# Patient Record
Sex: Female | Born: 1990
Health system: Southern US, Community
[De-identification: ages and names within clinical notes are randomized; demographics above are authoritative.]

## PROBLEM LIST (undated history)

## (undated) DIAGNOSIS — J302 Other seasonal allergic rhinitis: Secondary | ICD-10-CM

## (undated) DIAGNOSIS — Z789 Other specified health status: Secondary | ICD-10-CM

## (undated) HISTORY — PX: APPENDECTOMY: SHX54

## (undated) HISTORY — PX: NO PAST SURGERIES: SHX2092

---

## 2015-02-14 HISTORY — PX: APPENDECTOMY: SHX54

## 2019-02-14 NOTE — L&D Delivery Note (Signed)
Delivery Note At 6:53 AM a viable female was delivered via Vaginal, Spontaneous (Presentation: LOA).  APGAR: 7, 8; weight 7 lb 3.5 oz (3275 g).  After 1 minute, the cord was clamped and cut. 40 units of pitocin diluted in 1000cc LR was infused rapidly IV.  The placenta separated spontaneously and delivered via CCT and maternal pushing effort.  It was inspected and appears to be intact with a 3 VC.  Anesthesia: Epidural Episiotomy: None Lacerations: None Suture Repair:  Est. Blood Loss (mL): 100  Mom to postpartum.  Baby to Couplet care / Skin to Skin.  Jennifer Fisher 12/16/2019, 9:02 AM

## 2019-12-11 ENCOUNTER — Other Ambulatory Visit: Payer: Self-pay

## 2019-12-11 ENCOUNTER — Encounter (HOSPITAL_COMMUNITY): Payer: Self-pay | Admitting: Obstetrics & Gynecology

## 2019-12-11 ENCOUNTER — Inpatient Hospital Stay (HOSPITAL_COMMUNITY)
Admission: AD | Admit: 2019-12-11 | Discharge: 2019-12-11 | Disposition: A | Discharge status: Home / Self Care | Payer: Medicaid Other | Attending: Obstetrics & Gynecology | Admitting: Obstetrics & Gynecology

## 2019-12-11 ENCOUNTER — Inpatient Hospital Stay (HOSPITAL_BASED_OUTPATIENT_CLINIC_OR_DEPARTMENT_OTHER): Payer: Medicaid Other

## 2019-12-11 DIAGNOSIS — Z3687 Encounter for antenatal screening for uncertain dates: Secondary | ICD-10-CM | POA: Diagnosis not present

## 2019-12-11 DIAGNOSIS — M549 Dorsalgia, unspecified: Secondary | ICD-10-CM | POA: Diagnosis not present

## 2019-12-11 DIAGNOSIS — Z3A38 38 weeks gestation of pregnancy: Secondary | ICD-10-CM

## 2019-12-11 DIAGNOSIS — O99353 Diseases of the nervous system complicating pregnancy, third trimester: Secondary | ICD-10-CM

## 2019-12-11 DIAGNOSIS — Z20822 Contact with and (suspected) exposure to covid-19: Secondary | ICD-10-CM | POA: Insufficient documentation

## 2019-12-11 DIAGNOSIS — O99019 Anemia complicating pregnancy, unspecified trimester: Secondary | ICD-10-CM | POA: Diagnosis present

## 2019-12-11 DIAGNOSIS — R519 Headache, unspecified: Secondary | ICD-10-CM | POA: Insufficient documentation

## 2019-12-11 DIAGNOSIS — O99891 Other specified diseases and conditions complicating pregnancy: Secondary | ICD-10-CM

## 2019-12-11 DIAGNOSIS — G44209 Tension-type headache, unspecified, not intractable: Secondary | ICD-10-CM

## 2019-12-11 DIAGNOSIS — O26893 Other specified pregnancy related conditions, third trimester: Secondary | ICD-10-CM | POA: Diagnosis not present

## 2019-12-11 DIAGNOSIS — Z3A36 36 weeks gestation of pregnancy: Secondary | ICD-10-CM | POA: Diagnosis not present

## 2019-12-11 DIAGNOSIS — Z789 Other specified health status: Secondary | ICD-10-CM | POA: Diagnosis present

## 2019-12-11 DIAGNOSIS — D649 Anemia, unspecified: Secondary | ICD-10-CM | POA: Insufficient documentation

## 2019-12-11 DIAGNOSIS — Z8759 Personal history of other complications of pregnancy, childbirth and the puerperium: Secondary | ICD-10-CM

## 2019-12-11 DIAGNOSIS — O99013 Anemia complicating pregnancy, third trimester: Secondary | ICD-10-CM | POA: Diagnosis not present

## 2019-12-11 DIAGNOSIS — M791 Myalgia, unspecified site: Secondary | ICD-10-CM

## 2019-12-11 DIAGNOSIS — Z79899 Other long term (current) drug therapy: Secondary | ICD-10-CM | POA: Insufficient documentation

## 2019-12-11 DIAGNOSIS — O0930 Supervision of pregnancy with insufficient antenatal care, unspecified trimester: Secondary | ICD-10-CM

## 2019-12-11 DIAGNOSIS — O0933 Supervision of pregnancy with insufficient antenatal care, third trimester: Secondary | ICD-10-CM | POA: Diagnosis not present

## 2019-12-11 DIAGNOSIS — Z3480 Encounter for supervision of other normal pregnancy, unspecified trimester: Secondary | ICD-10-CM

## 2019-12-11 DIAGNOSIS — Z0289 Encounter for other administrative examinations: Secondary | ICD-10-CM

## 2019-12-11 LAB — HEPATITIS PANEL, ACUTE
HCV Ab: NONREACTIVE
Hep A IgM: NONREACTIVE
Hep B C IgM: NONREACTIVE
Hepatitis B Surface Ag: NONREACTIVE

## 2019-12-11 LAB — URINALYSIS, ROUTINE W REFLEX MICROSCOPIC
Bilirubin Urine: NEGATIVE
Glucose, UA: NEGATIVE mg/dL
Hgb urine dipstick: NEGATIVE
Ketones, ur: NEGATIVE mg/dL
Nitrite: NEGATIVE
Protein, ur: NEGATIVE mg/dL
Specific Gravity, Urine: 1.015 (ref 1.005–1.030)
pH: 6 (ref 5.0–8.0)

## 2019-12-11 LAB — COMPREHENSIVE METABOLIC PANEL
ALT: 17 U/L (ref 0–44)
AST: 22 U/L (ref 15–41)
Albumin: 2.6 g/dL — ABNORMAL LOW (ref 3.5–5.0)
Alkaline Phosphatase: 159 U/L — ABNORMAL HIGH (ref 38–126)
Anion gap: 10 (ref 5–15)
BUN: 8 mg/dL (ref 6–20)
CO2: 20 mmol/L — ABNORMAL LOW (ref 22–32)
Calcium: 8.9 mg/dL (ref 8.9–10.3)
Chloride: 106 mmol/L (ref 98–111)
Creatinine, Ser: 0.46 mg/dL (ref 0.44–1.00)
GFR, Estimated: >60 mL/min (ref >60)
Glucose, Bld: 84 mg/dL (ref 70–99)
Potassium: 4 mmol/L (ref 3.5–5.1)
Sodium: 136 mmol/L (ref 135–145)
Total Bilirubin: 0.3 mg/dL (ref 0.3–1.2)
Total Protein: 6.6 g/dL (ref 6.5–8.1)

## 2019-12-11 LAB — CBC
HCT: 30.4 % — ABNORMAL LOW (ref 36.0–46.0)
Hemoglobin: 9.1 g/dL — ABNORMAL LOW (ref 12.0–15.0)
MCH: 21.8 pg — ABNORMAL LOW (ref 26.0–34.0)
MCHC: 29.9 g/dL — ABNORMAL LOW (ref 30.0–36.0)
MCV: 72.9 fL — ABNORMAL LOW (ref 80.0–100.0)
Platelets: 317 10*3/uL (ref 150–400)
RBC: 4.17 MIL/uL (ref 3.87–5.11)
RDW: 18.2 % — ABNORMAL HIGH (ref 11.5–15.5)
WBC: 9.3 10*3/uL (ref 4.0–10.5)
nRBC: 0 % (ref 0.0–0.2)

## 2019-12-11 LAB — DIFFERENTIAL
Abs Immature Granulocytes: 0.15 10*3/uL — ABNORMAL HIGH (ref 0.00–0.07)
Basophils Absolute: 0.1 10*3/uL (ref 0.0–0.1)
Basophils Relative: 1 %
Eosinophils Absolute: 0.4 10*3/uL (ref 0.0–0.5)
Eosinophils Relative: 4 %
Immature Granulocytes: 2 %
Lymphocytes Relative: 18 %
Lymphs Abs: 1.7 10*3/uL (ref 0.7–4.0)
Monocytes Absolute: 0.7 10*3/uL (ref 0.1–1.0)
Monocytes Relative: 8 %
Neutro Abs: 6.3 10*3/uL (ref 1.7–7.7)
Neutrophils Relative %: 67 %

## 2019-12-11 LAB — WET PREP, GENITAL
Clue Cells Wet Prep HPF POC: NONE SEEN
Sperm: NONE SEEN
Trich, Wet Prep: NONE SEEN
Yeast Wet Prep HPF POC: NONE SEEN

## 2019-12-11 LAB — TYPE AND SCREEN
ABO/RH(D): A POS
Antibody Screen: NEGATIVE

## 2019-12-11 LAB — HEPATITIS B SURFACE ANTIGEN: Hepatitis B Surface Ag: NONREACTIVE

## 2019-12-11 LAB — RESPIRATORY PANEL BY RT PCR (FLU A&B, COVID)
Influenza A by PCR: NEGATIVE
Influenza B by PCR: NEGATIVE
SARS Coronavirus 2 by RT PCR: NEGATIVE

## 2019-12-11 LAB — RAPID HIV SCREEN (HIV 1/2 AB+AG)
HIV 1/2 Antibodies: NONREACTIVE
HIV-1 P24 Antigen - HIV24: NONREACTIVE

## 2019-12-11 LAB — PROTEIN / CREATININE RATIO, URINE
Creatinine, Urine: 77.31 mg/dL
Protein Creatinine Ratio: 0.21 mg/mg{Cre} — ABNORMAL HIGH (ref 0.00–0.15)
Total Protein, Urine: 16 mg/dL

## 2019-12-11 MED ORDER — CYCLOBENZAPRINE HCL 5 MG PO TABS
10.0000 mg | ORAL_TABLET | Freq: Once | ORAL | Status: AC
  Administered 2019-12-11: 10 mg via ORAL
  Filled 2019-12-11: qty 2

## 2019-12-11 MED ORDER — FERROUS SULFATE 325 (65 FE) MG PO TABS: ORAL_TABLET | ORAL | 3 refills | Status: DC

## 2019-12-11 MED ORDER — ACETAMINOPHEN 500 MG PO TABS
1000.0000 mg | ORAL_TABLET | Freq: Once | ORAL | Status: AC
  Administered 2019-12-11: 1000 mg via ORAL
  Filled 2019-12-11: qty 2

## 2019-12-11 MED ORDER — PRENATAL VITAMIN 27-0.8 MG PO TABS: 1.0000 | ORAL_TABLET | Freq: Every day | ORAL | 3 refills | Status: DC

## 2019-12-11 MED ORDER — LACTATED RINGERS IV BOLUS
1000.0000 mL | Freq: Once | INTRAVENOUS | Status: AC
  Administered 2019-12-11: 1000 mL via INTRAVENOUS

## 2019-12-11 NOTE — Discharge Instructions (Signed)
[facility] for [facility] Prenatal Care Providers          If the office does not call you for appt by tomorrow please call this number and schedule an initial prenatal visit.  [facility]  [address] [phone]

## 2019-12-11 NOTE — MAU Provider Note (Signed)
History     [id number]  Arrival date and time: 12/11/19 1209    Chief Complaint  Patient presents with  . Headache     HPI Jennifer Fisher is a 29 y.o. at [redacted]w[redacted]d reportedly by late ultrasound completed 12/07/19 with no reported PMHx, who presents for body aches to MAU. Patient is an [place] refugee who came to the [place] 2 months ago. Patient has been seeking prenatal care at a refugee camp in [place] where she reports she had an ultrasound and lab work. She was taking birth control and is unsure of her LMP. This was an unplanned pregnancy. She states her Case Manager has this information, but does not present with her to the MAU. She denies any complications with this pregnancy. She takes medication for headaches, unsure which medication. No other medications during or outside of pregnancy. She has no known medical problems. She does have a history of an IUFD at 38w with her G2, unknown reason. She has no known history of hypertension or diabetes. She denies alcohol tobacco and drug use. She has had an appendectomy and denies any other abdominal surgeries. Her living children have no known issues and she denies issues with these vaginal deliveries.  Today she presents for generalized body aches. She localizes her body aches to her upper back and headache. She has not taken any medication for this issue. This issue started approx 2 months ago and has worsened since this time. Her back pain is worse with movement and improves with rest. It is bilateral and intermittent in nature. She additionally notes intermittent headaches, also worse in the past 2 months. No associated vision changes. She denies cp, sob, palpitations. No LE edema.   She endorses intermittent contractions. She denies LOF. She denies vaginal bleeding.  --/--/A POS (10/28 1446)  OB History    Gravida  5   Para  4   Term  4   Preterm      AB      Living  3     SAB      TAB      Ectopic       Multiple      Live Births  3           History reviewed. No pertinent past medical history.  Past Surgical History:  Procedure Laterality Date  . APPENDECTOMY      History reviewed. No pertinent family history.  Social History   Socioeconomic History  . Marital status: Married    Spouse name: Not on file  . Number of children: Not on file  . Years of education: Not on file  . Highest education level: Not on file  Occupational History  . Not on file  Tobacco Use  . Smoking status: Not on file  Substance and Sexual Activity  . Alcohol use: Not on file  . Drug use: Not on file  . Sexual activity: Not on file  Other Topics Concern  . Not on file  Social History Narrative  . Not on file   Social Determinants of Health   Financial Resource Strain:   . Difficulty of Paying Living Expenses: Not on file  Food Insecurity:   . Worried About Programme researcher, broadcasting/film/video in the Last Year: Not on file  . Ran Out of Food in the Last Year: Not on file  Transportation Needs:   . Lack of Transportation (Medical): Not on file  . Lack of Transportation (Non-Medical): Not on  file  Physical Activity:   . Days of Exercise per Week: Not on file  . Minutes of Exercise per Session: Not on file  Stress:   . Feeling of Stress : Not on file  Social Connections:   . Frequency of Communication with Friends and Family: Not on file  . Frequency of Social Gatherings with Friends and Family: Not on file  . Attends Religious Services: Not on file  . Active Member of Clubs or Organizations: Not on file  . Attends Banker Meetings: Not on file  . Marital Status: Not on file  Intimate Partner Violence:   . Fear of Current or Ex-Partner: Not on file  . Emotionally Abused: Not on file  . Physically Abused: Not on file  . Sexually Abused: Not on file    No Known Allergies  No current facility-administered medications on file prior to encounter.   No current outpatient medications on  file prior to encounter.     Review of Systems  Constitutional: Negative for chills and fever.  HENT: Negative for congestion.   Eyes: Negative for blurred vision and double vision.  Respiratory: Negative for cough, hemoptysis and shortness of breath.   Cardiovascular: Negative for chest pain, palpitations and leg swelling.  Gastrointestinal: Negative for abdominal pain, constipation, diarrhea, nausea and vomiting.  Genitourinary: Negative for dysuria, frequency, hematuria and urgency.  Musculoskeletal: Positive for myalgias.  Skin: Negative for rash.  Neurological: Negative for dizziness, focal weakness, loss of consciousness, weakness and headaches.     Physical Exam   BP 120/87 (BP Location: Right Arm)   Pulse (!) 106   Temp 98.4 F (36.9 C) (Oral)   Resp 20   Ht 4\' 11"  (1.499 m)   Wt 59.4 kg   SpO2 99%   BMI 26.44 kg/m   Physical Exam Vitals and nursing note reviewed. Exam conducted with a chaperone present.  Constitutional:      General: She is not in acute distress.    Appearance: Normal appearance. She is normal weight.  HENT:     Head: Normocephalic and atraumatic.     Nose: Nose normal.     Mouth/Throat:     Mouth: Mucous membranes are moist.     Pharynx: Oropharynx is clear.  Eyes:     Extraocular Movements: Extraocular movements intact.     Conjunctiva/sclera: Conjunctivae normal.  Cardiovascular:     Rate and Rhythm: Normal rate.     Pulses: Normal pulses.  Pulmonary:     Effort: Pulmonary effort is normal.  Genitourinary:    General: Normal vulva.     Vagina: Vaginal discharge (white, chunky) present.  Musculoskeletal:        General: Normal range of motion.     Cervical back: Normal range of motion and neck supple.  Skin:    General: Skin is warm and dry.  Neurological:     General: No focal deficit present.     Mental Status: She is alert and oriented to person, place, and time. Mental status is at baseline.     Cranial Nerves: No cranial  nerve deficit.     Sensory: No sensory deficit.     Motor: No weakness.     Gait: Gait normal.  Psychiatric:        Mood and Affect: Mood normal.        Behavior: Behavior normal.     Cervical Exam Dilation: Fingertip Effacement (%): Thick Station: -2 Exam by:: Dr. [email or web address]  Bedside  Ultrasound Formal u/s ordered in the setting of no prenatal care  My interpretation: n/a  FHT Baseline 140bpm, mod variability, +accels, no decels Toco: irritable, intermittent contractions Cat: 1  Labs Results for orders placed or performed during the hospital encounter of 12/11/19 (from the past 24 hour(s))  Urinalysis, Routine w reflex microscopic Urine, Clean Catch     Status: Abnormal   Collection Time: 12/11/19  1:07 PM  Result Value Ref Range   Color, Urine YELLOW YELLOW   APPearance CLOUDY (A) CLEAR   Specific Gravity, Urine 1.015 1.005 - 1.030   pH 6.0 5.0 - 8.0   Glucose, UA NEGATIVE NEGATIVE mg/dL   Hgb urine dipstick NEGATIVE NEGATIVE   Bilirubin Urine NEGATIVE NEGATIVE   Ketones, ur NEGATIVE NEGATIVE mg/dL   Protein, ur NEGATIVE NEGATIVE mg/dL   Nitrite NEGATIVE NEGATIVE   Leukocytes,Ua LARGE (A) NEGATIVE   RBC / HPF 0-5 0 - 5 RBC/hpf   WBC, UA 0-5 0 - 5 WBC/hpf   Bacteria, UA MANY (A) NONE SEEN   Squamous Epithelial / LPF 11-20 0 - 5  Protein / creatinine ratio, urine     Status: Abnormal   Collection Time: 12/11/19  1:07 PM  Result Value Ref Range   Creatinine, Urine 77.31 mg/dL   Total Protein, Urine 16 mg/dL   Protein Creatinine Ratio 0.21 (H) 0.00 - 0.15 mg/mg[Cre]  Hepatitis B surface antigen     Status: None   Collection Time: 12/11/19  2:45 PM  Result Value Ref Range   Hepatitis B Surface Ag NON REACTIVE NON REACTIVE  CBC     Status: Abnormal   Collection Time: 12/11/19  2:45 PM  Result Value Ref Range   WBC 9.3 4.0 - 10.5 K/uL   RBC 4.17 3.87 - 5.11 MIL/uL   Hemoglobin 9.1 (L) 12.0 - 15.0 g/dL   HCT 76.2 (L) 36 - 46 %   MCV 72.9 (L) 80.0 - 100.0 fL    MCH 21.8 (L) 26.0 - 34.0 pg   MCHC 29.9 (L) 30.0 - 36.0 g/dL   RDW 83.1 (H) 51.7 - 61.6 %   Platelets 317 150 - 400 K/uL   nRBC 0.0 0.0 - 0.2 %  Differential     Status: Abnormal   Collection Time: 12/11/19  2:45 PM  Result Value Ref Range   Neutrophils Relative % 67 %   Neutro Abs 6.3 1.7 - 7.7 K/uL   Lymphocytes Relative 18 %   Lymphs Abs 1.7 0.7 - 4.0 K/uL   Monocytes Relative 8 %   Monocytes Absolute 0.7 0.1 - 1.0 K/uL   Eosinophils Relative 4 %   Eosinophils Absolute 0.4 0.0 - 0.5 K/uL   Basophils Relative 1 %   Basophils Absolute 0.1 0.0 - 0.1 K/uL   Immature Granulocytes 2 %   Abs Immature Granulocytes 0.15 (H) 0.00 - 0.07 K/uL  Rapid HIV screen (HIV 1/2 Ab+Ag)     Status: None   Collection Time: 12/11/19  2:45 PM  Result Value Ref Range   HIV-1 P24 Antigen - HIV24 NON REACTIVE NON REACTIVE   HIV 1/2 Antibodies NON REACTIVE NON REACTIVE   Interpretation (HIV Ag Ab)      A non reactive test result means that HIV 1 or HIV 2 antibodies and HIV 1 p24 antigen were not detected in the specimen.  Type and screen [facility]     Status: None   Collection Time: 12/11/19  2:46 PM  Result Value Ref Range  ABO/RH(D) A POS    Antibody Screen NEG    Sample Expiration      12/14/2019,2359 Performed at [facility], [address], [place], [place] [address]   Hepatitis panel, acute     Status: None   Collection Time: 12/11/19  3:12 PM  Result Value Ref Range   Hepatitis B Surface Ag NON REACTIVE NON REACTIVE   HCV Ab NON REACTIVE NON REACTIVE   Hep A IgM NON REACTIVE NON REACTIVE   Hep B C IgM NON REACTIVE NON REACTIVE  Comprehensive metabolic panel     Status: Abnormal   Collection Time: 12/11/19  3:12 PM  Result Value Ref Range   Sodium 136 135 - 145 mmol/L   Potassium 4.0 3.5 - 5.1 mmol/L   Chloride 106 98 - 111 mmol/L   CO2 20 (L) 22 - 32 mmol/L   Glucose, Bld 84 70 - 99 mg/dL   BUN 8 6 - 20 mg/dL   Creatinine, Ser 5.95 0.44 - 1.00 mg/dL    Calcium 8.9 8.9 - 63.8 mg/dL   Total Protein 6.6 6.5 - 8.1 g/dL   Albumin 2.6 (L) 3.5 - 5.0 g/dL   AST 22 15 - 41 U/L   ALT 17 0 - 44 U/L   Alkaline Phosphatase 159 (H) 38 - 126 U/L   Total Bilirubin 0.3 0.3 - 1.2 mg/dL   GFR, Estimated >75 >64 mL/min   Anion gap 10 5 - 15  Wet prep, genital     Status: Abnormal   Collection Time: 12/11/19  3:34 PM   Specimen: Vaginal/Rectal  Result Value Ref Range   Yeast Wet Prep HPF POC NONE SEEN NONE SEEN   Trich, Wet Prep NONE SEEN NONE SEEN   Clue Cells Wet Prep HPF POC NONE SEEN NONE SEEN   WBC, Wet Prep HPF POC MODERATE (A) NONE SEEN   Sperm NONE SEEN   Respiratory Panel by RT PCR (Flu A&B, Covid) - Nasopharyngeal Swab     Status: None   Collection Time: 12/11/19  3:48 PM   Specimen: Nasopharyngeal Swab  Result Value Ref Range   SARS Coronavirus 2 by RT PCR NEGATIVE NEGATIVE   Influenza A by PCR NEGATIVE NEGATIVE   Influenza B by PCR NEGATIVE NEGATIVE    Imaging No results found.  MAU Course  Procedures  Lab Orders     Culture, beta strep (group b only)     OB Urine Culture     Wet prep, genital     Respiratory Panel by RT PCR (Flu A&B, Covid) - Nasopharyngeal Swab     Urinalysis, Routine w reflex microscopic Urine, Clean Catch     Hepatitis B surface antigen     Rubella screen     RPR     CBC     Differential     Rapid HIV screen (HIV 1/2 Ab+Ag)     QuantiFERON-TB Gold Plus     Hepatitis panel, acute     Comprehensive metabolic panel     Hemoglobin P3I     Protein / creatinine ratio, urine Meds ordered this encounter  Medications  . acetaminophen (TYLENOL) tablet 1,000 mg  . lactated ringers bolus 1,000 mL  . cyclobenzaprine (FLEXERIL) tablet 10 mg  . Prenatal Vit-Fe Fumarate-FA (PRENATAL VITAMIN) 27-0.8 MG TABS    Sig: Take 1 tablet by mouth daily.    Dispense:  30 tablet    Refill:  3  . ferrous sulfate 325 (65 FE) MG  tablet    Sig: Take 1 tablet every other day.    Dispense:  30 tablet    Refill:  3     Imaging Orders     US MFM OB DETAIL +14 WK     US MFM FETAL BPP WO NON STRESS  MDM moderate  Assessment and Plan  28yo [id number] presents to MAU as an [place] refugee with generalized body aches.  #Generalized body aches #Headaches Patient presents with complaints of intermittent back pain and headaches over the past 2 months, worsening. She has not tried any interventions for these issues. She does not have any associated vision changes. Pain is worse in her upper back. Suspect most likely tension headache given presenting symptoms. VSS, but patient mildly tachycardic, HR 106. No elevated BP concerning for preE/HTN. LR bolus, flexeril, and tylenol administered in the setting of patient's symptoms with resolution.  #Limited/intermittent prenatal care #Refugee Anatomy u/s and prenatal labs collected today. BPP 8/10, discussed with Dr. Jolayne Pantheronstant, attending on call, okay with discharge. Follow up with [facility] arranged. Prenatal rx sent to pharmacy.  #Anemia of pregnancy hgb 9.1 on initial labs in MAU. Asymptomatic. Will prescribe ferrous sulfate 325 mg every other day.  #FWB FHT Cat 1 NST: reactive  Alric SetonAlicia C Dionel Archey

## 2019-12-11 NOTE — MAU Note (Signed)
Presents with c/o body pain, states every bone in her body is hurting.  States her head, shoulder blade, and legs hurt. States was seen in [place] while at a camp and seen every other day.  Denies VB or LOF.  Endorses +FM.

## 2019-12-12 LAB — RPR: RPR Ser Ql: NONREACTIVE

## 2019-12-12 LAB — CULTURE, OB URINE: Culture: >=100000 — AB

## 2019-12-12 LAB — GC/CHLAMYDIA PROBE AMP (~~LOC~~) NOT AT ARMC
Chlamydia: NEGATIVE
Comment: NEGATIVE
Comment: NORMAL
Neisseria Gonorrhea: NEGATIVE

## 2019-12-12 LAB — HEMOGLOBIN A1C
Hgb A1c MFr Bld: 5.8 % — ABNORMAL HIGH (ref 4.8–5.6)
Mean Plasma Glucose: 120 mg/dL

## 2019-12-12 LAB — RUBELLA SCREEN: Rubella: 17.6 index (ref >0.99)

## 2019-12-13 LAB — QUANTIFERON-TB GOLD PLUS: QuantiFERON-TB Gold Plus: NEGATIVE

## 2019-12-13 LAB — QUANTIFERON-TB GOLD PLUS (RQFGPL)
QuantiFERON Mitogen Value: 9.3 IU/mL
QuantiFERON Nil Value: 0 IU/mL
QuantiFERON TB1 Ag Value: 0.21 IU/mL
QuantiFERON TB2 Ag Value: 0.12 IU/mL

## 2019-12-13 LAB — CULTURE, BETA STREP (GROUP B ONLY)

## 2019-12-15 ENCOUNTER — Inpatient Hospital Stay (HOSPITAL_COMMUNITY)
Admission: AD | Admit: 2019-12-15 | Discharge: 2019-12-18 | DRG: 807 | Disposition: A | Discharge status: Home / Self Care | Payer: Medicaid Other | Attending: Family Medicine | Admitting: Family Medicine

## 2019-12-15 ENCOUNTER — Other Ambulatory Visit: Payer: Self-pay

## 2019-12-15 DIAGNOSIS — Z23 Encounter for immunization: Secondary | ICD-10-CM

## 2019-12-15 DIAGNOSIS — O26893 Other specified pregnancy related conditions, third trimester: Secondary | ICD-10-CM | POA: Diagnosis present

## 2019-12-15 DIAGNOSIS — E669 Obesity, unspecified: Secondary | ICD-10-CM | POA: Diagnosis present

## 2019-12-15 DIAGNOSIS — Z3A39 39 weeks gestation of pregnancy: Secondary | ICD-10-CM

## 2019-12-15 DIAGNOSIS — Z30017 Encounter for initial prescription of implantable subdermal contraceptive: Secondary | ICD-10-CM | POA: Diagnosis not present

## 2019-12-15 DIAGNOSIS — O99214 Obesity complicating childbirth: Secondary | ICD-10-CM | POA: Diagnosis present

## 2019-12-15 DIAGNOSIS — Z20822 Contact with and (suspected) exposure to covid-19: Secondary | ICD-10-CM | POA: Diagnosis present

## 2019-12-15 HISTORY — DX: Other specified health status: Z78.9

## 2019-12-15 LAB — CBC
HCT: 30.4 % — ABNORMAL LOW (ref 36.0–46.0)
Hemoglobin: 9 g/dL — ABNORMAL LOW (ref 12.0–15.0)
MCH: 21.5 pg — ABNORMAL LOW (ref 26.0–34.0)
MCHC: 29.6 g/dL — ABNORMAL LOW (ref 30.0–36.0)
MCV: 72.6 fL — ABNORMAL LOW (ref 80.0–100.0)
Platelets: 319 10*3/uL (ref 150–400)
RBC: 4.19 MIL/uL (ref 3.87–5.11)
RDW: 18.5 % — ABNORMAL HIGH (ref 11.5–15.5)
WBC: 9.3 10*3/uL (ref 4.0–10.5)
nRBC: 0.2 % (ref 0.0–0.2)

## 2019-12-15 MED ORDER — LACTATED RINGERS IV SOLN: INTRAVENOUS | Status: DC

## 2019-12-15 MED ORDER — OXYTOCIN BOLUS FROM INFUSION
333.0000 mL | Freq: Once | INTRAVENOUS | Status: AC
  Administered 2019-12-16: 333 mL via INTRAVENOUS

## 2019-12-15 MED ORDER — OXYCODONE-ACETAMINOPHEN 5-325 MG PO TABS: 1.0000 | ORAL_TABLET | ORAL | Status: DC | PRN

## 2019-12-15 MED ORDER — PHENYLEPHRINE 40 MCG/ML (10ML) SYRINGE FOR IV PUSH (FOR BLOOD PRESSURE SUPPORT): 80.0000 ug | PREFILLED_SYRINGE | INTRAVENOUS | Status: DC | PRN

## 2019-12-15 MED ORDER — ONDANSETRON HCL 4 MG/2ML IJ SOLN: 4.0000 mg | Freq: Four times a day (QID) | INTRAMUSCULAR | Status: DC | PRN

## 2019-12-15 MED ORDER — DIPHENHYDRAMINE HCL 50 MG/ML IJ SOLN: 12.5000 mg | INTRAMUSCULAR | Status: DC | PRN

## 2019-12-15 MED ORDER — LACTATED RINGERS IV SOLN: 500.0000 mL | INTRAVENOUS | Status: DC | PRN

## 2019-12-15 MED ORDER — EPHEDRINE 5 MG/ML INJ: 10.0000 mg | INTRAVENOUS | Status: DC | PRN

## 2019-12-15 MED ORDER — LIDOCAINE HCL (PF) 1 % IJ SOLN: 30.0000 mL | INTRAMUSCULAR | Status: DC | PRN

## 2019-12-15 MED ORDER — FENTANYL CITRATE (PF) 100 MCG/2ML IJ SOLN
50.0000 ug | INTRAMUSCULAR | Status: DC | PRN
  Administered 2019-12-16: 100 ug via INTRAVENOUS
  Filled 2019-12-15: qty 2

## 2019-12-15 MED ORDER — ACETAMINOPHEN 325 MG PO TABS: 650.0000 mg | ORAL_TABLET | ORAL | Status: DC | PRN

## 2019-12-15 MED ORDER — OXYCODONE-ACETAMINOPHEN 5-325 MG PO TABS: 2.0000 | ORAL_TABLET | ORAL | Status: DC | PRN

## 2019-12-15 MED ORDER — HYDROXYZINE HCL 50 MG PO TABS: 50.0000 mg | ORAL_TABLET | Freq: Four times a day (QID) | ORAL | Status: DC | PRN

## 2019-12-15 MED ORDER — LACTATED RINGERS IV SOLN: 500.0000 mL | Freq: Once | INTRAVENOUS | Status: DC

## 2019-12-15 MED ORDER — FLEET ENEMA 7-19 GM/118ML RE ENEM: 1.0000 | ENEMA | RECTAL | Status: DC | PRN

## 2019-12-15 MED ORDER — SOD CITRATE-CITRIC ACID 500-334 MG/5ML PO SOLN: 30.0000 mL | ORAL | Status: DC | PRN

## 2019-12-15 MED ORDER — FENTANYL-BUPIVACAINE-NACL 0.5-0.125-0.9 MG/250ML-% EP SOLN
12.0000 mL/h | EPIDURAL | Status: DC | PRN
  Filled 2019-12-15: qty 250

## 2019-12-15 MED ORDER — OXYTOCIN-SODIUM CHLORIDE 30-0.9 UT/500ML-% IV SOLN
2.5000 [IU]/h | INTRAVENOUS | Status: DC
  Administered 2019-12-16: 2.5 [IU]/h via INTRAVENOUS
  Filled 2019-12-15: qty 500

## 2019-12-15 NOTE — Progress Notes (Deleted)
OBSTETRIC ADMISSION HISTORY AND PHYSICAL  Jennifer Fisher is a 29 y.o. female No obstetric history on file. with IUP at [redacted]w[redacted]d by 24 week [place] presenting for back pain and contractions.  She states that she was told she was 7 months pregnant 2 months ago in [place]. She reports +FMs, No LOF, mild VB spotting after cervical exams, no recent trauma or falls, no blurry vision, headaches or peripheral edema, and RUQ pain.  She plans on breast feeding. She is undecided for birth control.  She received her prenatal care at an [employer] in [place]  She has a case Production designer, theatre/television/film Jennifer Fisher [phone]) through [employer] through which she has emergency medicaid application in process.   Jennifer states she has had 3 prenatal visits due to arrival to the [place] from the past couple of weeks.  She denies having issues with her previous pregnancy with the exception of 1 birth date GA with demise, which was her second pregnancy.  She denies any knowledge of blood pressure. She denies using any medications in this or previous pregnancies.  She states that she had an ultrasound done at 24 weeks which did not show any anatomic or size concerns.  Prenatal History/Complications:  Patient Active Problem List   Diagnosis Date Noted  . Indication for care in labor and delivery, antepartum 12/15/2019   Past Medical History: Denies past medical conditions or chronic medications.  Obstetrical History: G5 P3-1-0-3 had a preterm delivery for second pregnancy which resulted in demise. Social History Social History   Socioeconomic History  . Marital status: Married    Spouse name: Not on file  . Number of children: Not on file  . Years of education: Not on file  . Highest education level: Not on file  Occupational History  . Not on file  Tobacco Use  . Smoking status: Not on file  Substance and Sexual Activity  . Alcohol use: Not on file  . Drug use: Not on file  . Sexual  activity: Not on file  Other Topics Concern  . Not on file  Social History Narrative  . Not on file   Social Determinants of Health   Financial Resource Strain:   . Difficulty of Paying Living Expenses: Not on file  Food Insecurity:   . Worried About Programme researcher, broadcasting/film/video in the Last Year: Not on file  . Ran Out of Food in the Last Year: Not on file  Transportation Needs:   . Lack of Transportation (Medical): Not on file  . Lack of Transportation (Non-Medical): Not on file  Physical Activity:   . Days of Exercise per Week: Not on file  . Minutes of Exercise per Session: Not on file  Stress:   . Feeling of Stress : Not on file  Social Connections:   . Frequency of Communication with Friends and Family: Not on file  . Frequency of Social Gatherings with Friends and Family: Not on file  . Attends Religious Services: Not on file  . Active Member of Clubs or Organizations: Not on file  . Attends Banker Meetings: Not on file  . Marital Status: Not on file    Family History: No family history on file.  Allergies: No Known Allergies  No medications prior to admission.     Review of Systems   All systems reviewed and negative except as stated in HPI  Blood pressure 125/81, pulse 94, temperature 98.7 F (37.1 C), temperature  source Oral, resp. rate 19, SpO2 100 %. General appearance: alert and mild distress Heart: regular rate and rhythm Abdomen: soft, non-tender; bowel sounds normal Extremities: no sign of DVT Presentation: cephalic Fetal monitoringBaseline: 140 bpm, Variability: Good {> 6 bpm), Accelerations: Reactive and Decelerations: Absent Uterine activity irregular and Frequency: Every 3-6 minutes     Prenatal labs: ABO, Rh:   Antibody:   Rubella:   RPR:    HBsAg:    HIV:    GBS:    1 hr Glucola unknown Genetic screening  unknown Anatomy US unknown, patient reports 24 weeks normal  Prenatal Transfer Tool  Prenatal care with 3 reported  visits at the [place].  Results for orders placed or performed during the hospital encounter of 12/15/19 (from the past 24 hour(s))  CBC   Collection Time: 12/15/19 11:29 PM  Result Value Ref Range   WBC 9.3 4.0 - 10.5 K/uL   RBC 4.19 3.87 - 5.11 MIL/uL   Hemoglobin 9.0 (L) 12.0 - 15.0 g/dL   HCT 09.3 (L) 36 - 46 %   MCV 72.6 (L) 80.0 - 100.0 fL   MCH 21.5 (L) 26.0 - 34.0 pg   MCHC 29.6 (L) 30.0 - 36.0 g/dL   RDW 26.7 (H) 12.4 - 58.0 %   Platelets 319 150 - 400 K/uL   nRBC 0.2 0.0 - 0.2 %    Patient Active Problem List   Diagnosis Date Noted  . Indication for care in labor and delivery, antepartum 12/15/2019    Assessment/Plan:  Jennifer Fisher is a 30 y.o. [id number] at [redacted]w[redacted]d here for SOL  #Labor: Initial CVE was 4.5/80/-2 with bulging bag. Significant back pain this time.  Expectant management. #Pain: Epidural if patient desires. #FWB: Cat 1 #ID:  Unknown, obtain GBS PCR #MOF: Breast #MOC: Undecided, but desires counseling  #Circ:  Desires #Limited prenatal care: In setting of recent move from [place] with possible respiratory prenatal visits.  Has a case manager listed in the HPI.  Plan to have social work see before discharge.   Jennifer Oka, MD  12/16/2019, 12:02 AM

## 2019-12-16 ENCOUNTER — Inpatient Hospital Stay (HOSPITAL_COMMUNITY): Payer: Medicaid Other | Admitting: Anesthesiology

## 2019-12-16 ENCOUNTER — Other Ambulatory Visit: Payer: Self-pay

## 2019-12-16 ENCOUNTER — Encounter (HOSPITAL_COMMUNITY): Payer: Self-pay | Admitting: Obstetrics and Gynecology

## 2019-12-16 DIAGNOSIS — Z3A39 39 weeks gestation of pregnancy: Secondary | ICD-10-CM

## 2019-12-16 LAB — RESPIRATORY PANEL BY RT PCR (FLU A&B, COVID)
Influenza A by PCR: NEGATIVE
Influenza B by PCR: NEGATIVE
SARS Coronavirus 2 by RT PCR: NEGATIVE

## 2019-12-16 LAB — RAPID HIV SCREEN (HIV 1/2 AB+AG)
HIV 1/2 Antibodies: NONREACTIVE
HIV-1 P24 Antigen - HIV24: NONREACTIVE

## 2019-12-16 LAB — RPR: RPR Ser Ql: NONREACTIVE

## 2019-12-16 LAB — GROUP B STREP BY PCR: Group B strep by PCR: NEGATIVE

## 2019-12-16 LAB — HEPATITIS B SURFACE ANTIGEN: Hepatitis B Surface Ag: NONREACTIVE

## 2019-12-16 LAB — TYPE AND SCREEN
ABO/RH(D): A POS
Antibody Screen: NEGATIVE

## 2019-12-16 MED ORDER — DIPHENHYDRAMINE HCL 25 MG PO CAPS: 25.0000 mg | ORAL_CAPSULE | Freq: Four times a day (QID) | ORAL | Status: DC | PRN

## 2019-12-16 MED ORDER — FERROUS SULFATE 325 (65 FE) MG PO TABS
325.0000 mg | ORAL_TABLET | Freq: Two times a day (BID) | ORAL | Status: DC
  Administered 2019-12-16 – 2019-12-18 (×4): 325 mg via ORAL
  Filled 2019-12-16 (×4): qty 1

## 2019-12-16 MED ORDER — TETANUS-DIPHTH-ACELL PERTUSSIS 5-2.5-18.5 LF-MCG/0.5 IM SUSY: 0.5000 mL | PREFILLED_SYRINGE | Freq: Once | INTRAMUSCULAR | Status: DC

## 2019-12-16 MED ORDER — DIBUCAINE (PERIANAL) 1 % EX OINT: 1.0000 "application " | TOPICAL_OINTMENT | CUTANEOUS | Status: DC | PRN

## 2019-12-16 MED ORDER — IBUPROFEN 600 MG PO TABS
600.0000 mg | ORAL_TABLET | Freq: Four times a day (QID) | ORAL | Status: DC
  Administered 2019-12-16 – 2019-12-18 (×7): 600 mg via ORAL
  Filled 2019-12-16 (×8): qty 1

## 2019-12-16 MED ORDER — METHYLERGONOVINE MALEATE 0.2 MG/ML IJ SOLN: 0.2000 mg | INTRAMUSCULAR | Status: DC | PRN

## 2019-12-16 MED ORDER — SIMETHICONE 80 MG PO CHEW
80.0000 mg | CHEWABLE_TABLET | ORAL | Status: DC | PRN
  Administered 2019-12-17: 80 mg via ORAL
  Filled 2019-12-16: qty 1

## 2019-12-16 MED ORDER — MEASLES, MUMPS & RUBELLA VAC IJ SOLR: 0.5000 mL | Freq: Once | INTRAMUSCULAR | Status: DC

## 2019-12-16 MED ORDER — WITCH HAZEL-GLYCERIN EX PADS: 1.0000 "application " | MEDICATED_PAD | CUTANEOUS | Status: DC | PRN

## 2019-12-16 MED ORDER — ACETAMINOPHEN 325 MG PO TABS
650.0000 mg | ORAL_TABLET | ORAL | Status: DC | PRN
  Administered 2019-12-16 – 2019-12-17 (×3): 650 mg via ORAL
  Filled 2019-12-16 (×3): qty 2

## 2019-12-16 MED ORDER — FLEET ENEMA 7-19 GM/118ML RE ENEM: 1.0000 | ENEMA | Freq: Every day | RECTAL | Status: DC | PRN

## 2019-12-16 MED ORDER — BENZOCAINE-MENTHOL 20-0.5 % EX AERO: 1.0000 "application " | INHALATION_SPRAY | CUTANEOUS | Status: DC | PRN

## 2019-12-16 MED ORDER — SODIUM CHLORIDE (PF) 0.9 % IJ SOLN
INTRAMUSCULAR | Status: DC | PRN
  Administered 2019-12-16: 10.5 mL/h via EPIDURAL

## 2019-12-16 MED ORDER — LIDOCAINE HCL (PF) 1 % IJ SOLN
INTRAMUSCULAR | Status: DC | PRN
  Administered 2019-12-16: 4 mL via EPIDURAL
  Administered 2019-12-16: 3 mL via EPIDURAL

## 2019-12-16 MED ORDER — PRENATAL MULTIVITAMIN CH
1.0000 | ORAL_TABLET | Freq: Every day | ORAL | Status: DC
  Administered 2019-12-17 – 2019-12-18 (×2): 1 via ORAL
  Filled 2019-12-16 (×2): qty 1

## 2019-12-16 MED ORDER — DOCUSATE SODIUM 100 MG PO CAPS
100.0000 mg | ORAL_CAPSULE | Freq: Two times a day (BID) | ORAL | Status: DC
  Administered 2019-12-16 – 2019-12-18 (×4): 100 mg via ORAL
  Filled 2019-12-16 (×4): qty 1

## 2019-12-16 MED ORDER — COCONUT OIL OIL
1.0000 "application " | TOPICAL_OIL | Status: DC | PRN
  Administered 2019-12-18: 1 via TOPICAL

## 2019-12-16 MED ORDER — ZOLPIDEM TARTRATE 5 MG PO TABS: 5.0000 mg | ORAL_TABLET | Freq: Every evening | ORAL | Status: DC | PRN

## 2019-12-16 MED ORDER — ONDANSETRON HCL 4 MG PO TABS: 4.0000 mg | ORAL_TABLET | ORAL | Status: DC | PRN

## 2019-12-16 MED ORDER — ONDANSETRON HCL 4 MG/2ML IJ SOLN
4.0000 mg | INTRAMUSCULAR | Status: DC | PRN
  Administered 2019-12-17: 4 mg via INTRAVENOUS
  Filled 2019-12-16: qty 2

## 2019-12-16 MED ORDER — BISACODYL 10 MG RE SUPP: 10.0000 mg | Freq: Every day | RECTAL | Status: DC | PRN

## 2019-12-16 MED ORDER — METHYLERGONOVINE MALEATE 0.2 MG PO TABS: 0.2000 mg | ORAL_TABLET | ORAL | Status: DC | PRN

## 2019-12-16 NOTE — Anesthesia Preprocedure Evaluation (Signed)
Anesthesia Evaluation  Patient identified by MRN, date of birth, ID band Patient awake    Reviewed: Allergy & Precautions, Patient's Chart, lab work & pertinent test results  Airway Mallampati: II  TM Distance: >3 FB Neck ROM: Full    Dental no notable dental hx. (+) Teeth Intact   Pulmonary neg pulmonary ROS,    Pulmonary exam normal breath sounds clear to auscultation       Cardiovascular negative cardio ROS Normal cardiovascular exam Rhythm:Regular Rate:Normal     Neuro/Psych negative neurological ROS  negative psych ROS   GI/Hepatic Neg liver ROS, GERD  ,  Endo/Other  Obesity  Renal/GU negative Renal ROS  negative genitourinary   Musculoskeletal negative musculoskeletal ROS (+)   Abdominal (+) + obese,   Peds  Hematology  (+) anemia ,   Anesthesia Other Findings   Reproductive/Obstetrics (+) Pregnancy                             Anesthesia Physical Anesthesia Plan  ASA: II  Anesthesia Plan: Epidural   Post-op Pain Management:    Induction:   PONV Risk Score and Plan:   Airway Management Planned: Natural Airway  Additional Equipment:   Intra-op Plan:   Post-operative Plan:   Informed Consent: I have reviewed the patients History and Physical, chart, labs and discussed the procedure including the risks, benefits and alternatives for the proposed anesthesia with the patient or authorized representative who has indicated his/her understanding and acceptance.       Plan Discussed with: Anesthesiologist  Anesthesia Plan Comments:         Anesthesia Quick Evaluation  

## 2019-12-16 NOTE — Anesthesia Postprocedure Evaluation (Signed)
Anesthesia Post Note  Patient: Jennifer Fisher  Procedure(s) Performed: AN AD HOC LABOR EPIDURAL     Patient location during evaluation: Mother Baby Anesthesia Type: Epidural Level of consciousness: awake and alert and oriented Pain management: satisfactory to patient Vital Signs Assessment: post-procedure vital signs reviewed and stable Respiratory status: respiratory function stable Cardiovascular status: stable Postop Assessment: no headache, no backache, epidural receding, patient able to bend at knees, no signs of nausea or vomiting and adequate PO intake Anesthetic complications: no   No complications documented.  Last Vitals:  Vitals:   12/16/19 0858 12/16/19 0915  BP:  115/77  Pulse:  62  Resp: 16 18  Temp:  37.1 C  SpO2:  98%    Last Pain:  Vitals:   12/16/19 0915  TempSrc: Oral  PainSc:    Pain Goal:                   [name]

## 2019-12-16 NOTE — H&P (Addendum)
OBSTETRIC ADMISSION HISTORY AND PHYSICAL  Jennifer Fisher is a 29 y.o. female No obstetric history on file. with IUP at [redacted]w[redacted]d by 24 week [place] presenting for back pain and contractions.  She states that she was told she was 7 months pregnant 2 months ago in [place]. She reports +FMs, No LOF, mild VB spotting after cervical exams, no recent trauma or falls, no blurry vision, headaches or peripheral edema, and RUQ pain.  She plans on breast feeding. She is undecided for birth control.  She received her prenatal care at an [employer] in [place]  She has a case Freight forwarder Ena Dawley Vie [phone]) through [employer] through which she has emergency medicaid application in process.   Jennifer states she has had 3 prenatal visits due to arrival to the [place] from the past couple of weeks.  She denies having issues with her previous pregnancy with the exception of 1 birth date GA with demise, which was her second pregnancy.  She denies any knowledge of blood pressure. She denies using any medications in this or previous pregnancies.  She states that she had an ultrasound done at 24 weeks which did not show any anatomic or size concerns.  Prenatal History/Complications:  Patient Active Problem List   Diagnosis Date Noted   Indication for care in labor and delivery, antepartum 12/15/2019   Past Medical History: Denies past medical conditions or chronic medications.  Obstetrical History: G5 P3-1-0-3 had a preterm delivery for second pregnancy which resulted in demise. Social History Social History   Socioeconomic History   Marital status: Married    Spouse name: Not on file   Number of children: Not on file   Years of education: Not on file   Highest education level: Not on file  Occupational History   Not on file  Tobacco Use   Smoking status: Not on file  Substance and Sexual Activity   Alcohol use: Not on file   Drug use: Not on file   Sexual activity:  Not on file  Other Topics Concern   Not on file  Social History Narrative   Not on file   Social Determinants of Health   Financial Resource Strain:    Difficulty of Paying Living Expenses: Not on file  Food Insecurity:    Worried About [place] in the Last Year: Not on file   Ran Out of Food in the Last Year: Not on file  Transportation Needs:    Lack of Transportation (Medical): Not on file   Lack of Transportation (Non-Medical): Not on file  Physical Activity:    Days of Exercise per Week: Not on file   Minutes of Exercise per Session: Not on file  Stress:    Feeling of Stress : Not on file  Social Connections:    Frequency of Communication with Friends and Family: Not on file   Frequency of Social Gatherings with Friends and Family: Not on file   Attends Religious Services: Not on file   Active Member of Clubs or Organizations: Not on file   Attends Archivist Meetings: Not on file   Marital Status: Not on file    Family History: No family history on file.  Allergies: No Known Allergies  No medications prior to admission.     Review of Systems   All systems reviewed and negative except as stated in HPI  Blood pressure 125/81, pulse 94, temperature 98.7 F (37.1 C), temperature  source Oral, resp. rate 19, SpO2 100 %. General appearance: alert and mild distress Heart: regular rate and rhythm Abdomen: soft, non-tender; bowel sounds normal Extremities: no sign of DVT Presentation: cephalic Fetal monitoringBaseline: 140 bpm, Variability: Good {> 6 bpm), Accelerations: Reactive and Decelerations: Absent Uterine activity irregular and Frequency: Every 3-6 minutes     Prenatal labs: ABO, Rh:   Antibody:   Rubella:   RPR:    HBsAg:    HIV:    GBS:    1 hr Glucola unknown Genetic screening  unknown Anatomy US unknown, patient reports 24 weeks normal  Prenatal Transfer Tool  Prenatal care with 3 reported visits at the [place] in [place].  Results for orders placed or performed during the hospital encounter of 12/15/19 (from the past 24 hour(s))  CBC   Collection Time: 12/15/19 11:29 PM  Result Value Ref Range   WBC 9.3 4.0 - 10.5 K/uL   RBC 4.19 3.87 - 5.11 MIL/uL   Hemoglobin 9.0 (L) 12.0 - 15.0 g/dL   HCT 30.4 (L) 36 - 46 %   MCV 72.6 (L) 80.0 - 100.0 fL   MCH 21.5 (L) 26.0 - 34.0 pg   MCHC 29.6 (L) 30.0 - 36.0 g/dL   RDW 18.5 (H) 11.5 - 15.5 %   Platelets 319 150 - 400 K/uL   nRBC 0.2 0.0 - 0.2 %    Patient Active Problem List   Diagnosis Date Noted   Indication for care in labor and delivery, antepartum 12/15/2019    Assessment/Plan:  Jennifer Fisher is a 29 y.o. [id number] at 9w1dhere for SOL  #Labor: Initial CVE was 4.5/80/-2 with bulging bag. Significant back pain this time.  Expectant management. #Pain: Epidural if patient desires. #FWB: Cat 1 #ID:  Unknown, obtain GBS PCR #MOF: Breast #MOC: Undecided, but desires counseling  #Circ:  Desires #Limited prenatal care: In [place].  Has a case manager listed in the HPI.  Plan to have social work see before discharge.   RBaldo Ash MD  12/16/2019, 12:02 AM  I personally saw and evaluated the patient, performing the key elements of the service. I developed and verified the management plan that is described in the resident's/student's note, and I agree with the content with my edits above. VSS, HRR&R, Resp unlabored, Legs neg.  FNigel Berthold CNM 12/16/2019 9:08 AM

## 2019-12-16 NOTE — Anesthesia Postprocedure Evaluation (Signed)
Anesthesia Post Note  Patient: Kristiana Erxleben  Procedure(s) Performed: AN AD HOC LABOR EPIDURAL     Patient location during evaluation: Mother Baby Anesthesia Type: Epidural Level of consciousness: awake and alert and oriented Pain management: satisfactory to patient Vital Signs Assessment: post-procedure vital signs reviewed and stable Respiratory status: respiratory function stable Cardiovascular status: stable Postop Assessment: no headache, no backache, epidural receding, patient able to bend at knees, no signs of nausea or vomiting and adequate PO intake Anesthetic complications: no   No complications documented.  Last Vitals:  Vitals:   12/16/19 0858 12/16/19 0915  BP:  115/77  Pulse:  62  Resp: 16 18  Temp:  37.1 C  SpO2:  98%    Last Pain:  Vitals:   12/16/19 0915  TempSrc: Oral  PainSc:    Pain Goal:                   Murel Shenberger     

## 2019-12-16 NOTE — Discharge Summary (Addendum)
Postpartum Discharge Summary     Patient Name: Jennifer Fisher DOB: [birth year] MRN: [id number]  Date of admission: 12/15/2019 Delivery date:12/16/2019  Delivering provider: Christin Fudge  Date of discharge: 12/18/2019  Admitting diagnosis: Indication for care in labor and delivery, antepartum [O75.9] Intrauterine pregnancy: [redacted]w[redacted]d    Secondary diagnosis:  Principal Problem:   Vaginal delivery Active Problems:   Indication for care in labor and delivery, antepartum   Additional [place]  Limited PNC,language barrier (Surveyor, mining    Discharge diagnosis: Term Pregnancy Delivered                                              Post partum procedures: Nexplanon, MMR/TDaP/Flu vaccines Augmentation: AROM Complications: None  Hospital course: Onset of Labor With Vaginal Delivery      29y.o. yo [id number] 332w1das admitted in Active Labor on 12/15/2019. Patient had an uncomplicated labor course as follows:  Membrane Rupture Time/Date: 5:31 AM ,12/16/2019   Delivery Method:Vaginal, Spontaneous  Episiotomy: None  Lacerations:  None  Patient had an uncomplicated postpartum course.  She is ambulating, tolerating a regular diet, passing flatus, and urinating well. Patient is discharged home in stable condition on 12/18/19.  Newborn Data: Birth date:12/16/2019  Birth time:6:53 AM  Gender:Female  Living status:Living  Apgars:7 ,8  Weight:3275 g   Magnesium Sulfate received: No BMZ received: No Rhophylac:N/A MMR:Yes  T-DaP:Given postpartum  Flu: Yes Transfusion:No  Physical exam  Vitals:   12/17/19 0505 12/17/19 1337 12/17/19 2140 12/18/19 0512  BP: 106/73 119/71 119/74 112/87  Pulse: 86 72 85 64  Resp: 16 16 18 18   Temp: 97.9 F (36.6 C) 98.9 F (37.2 C) 98.2 F (36.8 C) 98.2 F (36.8 C)  TempSrc: Oral Oral Oral Oral  SpO2: 100% 99% 100% 100%  Height:       General: alert, cooperative and no distress Lochia:  appropriate Uterine Fundus: firm Incision: N/A DVT Evaluation: No evidence of DVT seen on physical exam. No cords or calf tenderness. No significant calf/ankle edema. Labs: Lab Results  Component Value Date   WBC 9.3 12/15/2019   HGB 9.0 (L) 12/15/2019   HCT 30.4 (L) 12/15/2019   MCV 72.6 (L) 12/15/2019   PLT 319 12/15/2019   No flowsheet data found. Edinburgh Score: Edinburgh Postnatal Depression Scale Screening Tool 12/16/2019  I have been able to laugh and see the funny side of things. 0  I have looked forward with enjoyment to things. 0  I have blamed myself unnecessarily when things went wrong. 0  I have been anxious or worried for no good reason. 0  I have felt scared or panicky for no good reason. 0  Things have been getting on top of me. 1  I have been so unhappy that I have had difficulty sleeping. 0  I have felt sad or miserable. 0  I have been so unhappy that I have been crying. 0  The thought of harming myself has occurred to me. 0  Edinburgh Postnatal Depression Scale Total 1     After visit meds:  Allergies as of 12/18/2019   No Known Allergies     Medication List    TAKE these medications   acetaminophen 325 MG tablet Commonly known as: Tylenol Take 2 tablets (650 mg total) by mouth every 4 (  four) hours as needed (for pain scale < 4).   coconut oil Oil Apply 1 application topically as needed.   etonogestrel 68 MG Impl implant Commonly known as: NEXPLANON 1 each (68 mg total) by Subdermal route once for 1 dose.   ferrous sulfate 325 (65 FE) MG tablet Take 1 tablet (325 mg total) by mouth daily with breakfast.   ibuprofen 600 MG tablet Commonly known as: ADVIL Take 1 tablet (600 mg total) by mouth every 6 (six) hours.   prenatal multivitamin Tabs tablet Take 1 tablet by mouth daily.         Discharge home in stable condition Infant Feeding: Bottle and Breast Infant Disposition:home with mother Discharge instruction: per After Visit  Summary and Postpartum booklet. Activity: Advance as tolerated. Pelvic rest for 6 weeks.  Diet: routine diet Future Appointments:No future appointments. Follow up Visit:  [place] [facility] [place] [facility]. Schedule an appointment as soon as possible for a visit in 4 week(s).   Specialty: Obstetrics and Gynecology Why: Make an appointment at this clinic for your 4 week follow up appointment.   Contact information: [address] [place] [address] [place] Assessment Unit. Go to.   Specialty: Obstetrics and Gynecology Why: Return to the MAU if you develop any of the following: - fever of 100.4*F or higher - worsening bleeding  - worsening pain not relieved by tylenol or ibuprofen - feel unsafe caring for yourself or baby  Contact information: [address] [id number] [place] [phone]              Please schedule this patient for a In person postpartum visit in 4 weeks with the following provider: Any provider. Additional Postpartum F/U:  Low risk pregnancy complicated by: nothing Delivery mode:  Vaginal, Spontaneous  Anticipated Birth Control: Nexplanon placed prior to discharge   12/18/2019 Randa Ngo, MD

## 2019-12-16 NOTE — Progress Notes (Signed)
Vitals:   12/16/19 0516 12/16/19 0530  BP:  128/81  Pulse:  81  Resp:    Temp:  98.7 F (37.1 C)  SpO2: 98% 100%   Cx 8/80/-1 .  AROM w/light mec fluid.  Ctx q 2-4 minutes, FHR Cat 1.  Continue present mgt.

## 2019-12-16 NOTE — Anesthesia Procedure Notes (Signed)
Epidural Patient location during procedure: OB  Staffing Anesthesiologist: Mal Amabile, MD Performed: anesthesiologist   Preanesthetic Checklist Completed: patient identified, IV checked, site marked, risks and benefits discussed, surgical consent, monitors and equipment checked, pre-op evaluation and timeout performed  Epidural Patient position: sitting Prep: DuraPrep and site prepped and draped Patient monitoring: continuous pulse ox and blood pressure Approach: midline Location: L3-L4 Injection technique: LOR air  Needle:  Needle type: Tuohy  Needle gauge: 17 G Needle length: 9 cm and 9 Needle insertion depth: 4 cm Catheter type: closed end flexible Catheter size: 19 Gauge Catheter at skin depth: 9 cm Test dose: negative and Other  Assessment Events: blood not aspirated, injection not painful, no injection resistance, no paresthesia and negative IV test  Additional Notes Patient identified. Risks and benefits discussed including failed block, incomplete  Pain control, post dural puncture headache, nerve damage, paralysis, blood pressure Changes, nausea, vomiting, reactions to medications-both toxic and allergic and post Partum back pain. All questions were answered. Patient expressed understanding and wished to proceed. Sterile technique was used throughout procedure. Epidural site was Dressed with sterile barrier dressing. No paresthesias, signs of intravascular injection Or signs of intrathecal spread were encountered.  Patient was more comfortable after the epidural was dosed. Please see RN's note for documentation of vital signs and FHR which are stable. Dari interpreter used during procedure.Reason for block:procedure for pain

## 2019-12-16 NOTE — Progress Notes (Addendum)
CSW consulted due to [name] being refugee from [place] and in need of resources. CSW also noted that consult mentioned [name] having 3 PNC visits while in [place] and in [place] in [place], with no no further issues with [facility]. CSW went to speak with [name] at bedside to address further needs.   CSW asked for permission to enter the room in which CSW noted [name] and [name] were both initially asleep. CSW kindly woke [name] up as CSW had Jennifer Fisher on the phone requesting to speak to [name]. [name] spoke with Jennifer Fisher while CSW worked to get [place]. CSW unable to obtain Dari interpretor as representative advised CSW that all were busy at the time. CSW was advised by [name] that her and [name] can also speak Farsi, therefore CSW used Farsi interpretor to speak with [name] and [name] Jennifer Fisher [phone]). CSW congratulated [name] and [name] on the birth of infant. CSW advised [name] and [name] of CSW's role and the reason for CSW coming to speak with them. [name] started by telling CSW that they are from [place]. Per [name] they arrived 10 days ago here in [place] with there 27 year old, 27 year old, and 27 1/27 year old.  [name] expressed that they have 3 other children at the home and that they have been working with case manager Jennifer Fisher to get other resources. [name] expressed that they have a number of sponsors that help them but reports that they are only given $1200 to live off of. [name] expressed that they are working with Jennifer Fisher to get [employer] as well as Medicaid. [name] expressed that they were forced to leave [place]  therefore they only were able to bring the items that they already had. [name] reported that they have no clothes for infant and very few to none for self. CSW offered [name] what resources CSW was aware of as well as provided [name], [name] and children at home with clothes located at this time.CSW will also provide [name] with Baby Bundle as well. [name] did report that they do not have carseat for infant at this time with no money to afford  one. CSW advised parents that CSW would search out carseat options for infant.  [name] expressed that they came alone to the [place] and expressed the only supports that he and [name] have are "our sponsors". [name] reported that they do have basinet for infant to sleep in once he arrives home.   CSW inquired specifically from [name] on mental health hx in which [name] answered never having any mental health hx and denies SI and HI to thus CSW. [name] began to speak again and expressed that they do not have transportation to places at this time but does report that Jennifer Fisher is working on getting bus fair for them to travel with. [name] asked CSW to assist with anything that family may be eligible for.   At this CSW has given [name] and [name] all clothing that can be located for them. CSW will give baby box and carseat on 12/17/19. CSW was made aware that that at this time [name] and [name] have no other needs but report "we are just really tired:. CSW to follow up with [name] and [name] on 12/17/19 for further needs.     Jennifer Fisher, MSW, LCSW [facility] [place] [phone]

## 2019-12-16 NOTE — MAU Note (Signed)
.  Jennifer Fisher is a 29 y.o. at approximately [redacted] weeks gestation, per patient and her husband, here in MAU reporting: CTX. +FM. The patient reports her cervix was checked 3-4 days ago but is unable to communicate where she received her prenatal visit. Cathie Beams, CNM and Dr. Kathrin Greathouse, MD, at bedside during check-in process. FHR reassuring with a 140 initially.  The patient is an [place] refugee and speaks Dari. The patient reports while in [place] approximately two months ago, she was told that she was seven months pregnant. The patient stated that when she arrived to the [place] in [place], she was screened and was told that "everything with the baby was okay."  Cathie Beams, CNM, placed admission orders.

## 2019-12-16 NOTE — Progress Notes (Signed)
Patient Vitals for the past 4 hrs:  BP Temp Temp src Pulse Resp SpO2 Height  12/16/19 0330 94/61 -- -- 76 16 99 % --  12/16/19 0300 112/65 -- -- 77 17 100 % --  12/16/19 0230 104/69 -- -- 68 18 99 % --  12/16/19 0200 123/77 -- -- 65 17 98 % --  12/16/19 0140 105/73 -- -- 66 15 98 % --  12/16/19 0135 113/70 -- -- 67 18 98 % --  12/16/19 0130 118/77 -- -- 66 16 99 % --  12/16/19 0125 116/80 -- -- 70 17 99 % --  12/16/19 0120 120/67 -- -- 69 18 98 % --  12/16/19 0117 110/66 -- -- 71 18 99 % --  12/16/19 0110 114/77 -- -- 66 17 98 % --  12/16/19 0105 131/72 -- -- 79 18 99 % --  12/16/19 0100 120/77 -- -- 82 17 99 % --  12/16/19 0026 -- -- -- -- -- -- 4' 11.06" (1.5 m)  12/16/19 0004 131/88 98.1 F (36.7 C) Oral 77 17 -- --   Comfortable w/epidural. FHR Cat 1  Ctx q 2-3 minutes. Continue w/expectant mgt.

## 2019-12-16 NOTE — Progress Notes (Signed)
CSW received call from RN advising CSW that [name] doesn't have money to purchase food and is requesting meal tickets. CSW provided [name] with 2 meal tickets at this time. CSW also made aware by RN that [name] is in need of phone charger as he doesn't have one. [name] requested contact information for case manager Kenney Houseman [phone]) to ask about brining phone charger to hospital. CSW provide RN with contact information however CSW notified that phone in room will not call out. CSW will call number and request phone charger for [name].    Jennifer Fisher Jennifer Fisher, MSW, LCSW [facility] [place] [phone]

## 2019-12-17 LAB — RUBELLA SCREEN: Rubella: 22.8 index (ref >0.99)

## 2019-12-17 NOTE — Progress Notes (Addendum)
Post Partum Day 1 Subjective: no complaints, up ad lib and tolerating PO  Objective: Blood pressure 106/73, pulse 86, temperature 97.9 F (36.6 C), temperature source Oral, resp. rate 16, height 4' 11.06" (1.5 m), last menstrual period 03/17/2019, SpO2 100 %, unknown if currently breastfeeding.  Physical Exam:  General: alert and no distress Lochia: appropriate Uterine Fundus: firm Incision: n/a DVT Evaluation: No evidence of DVT seen on physical exam. No cords or calf tenderness. No significant calf/ankle edema.  Recent Labs    12/15/19 2329  HGB 9.0*  HCT 30.4*    Assessment/Plan:  S/p SVD, PPD#1.   Plan for discharge tomorrow, Breastfeeding, Lactation consult, Social Work consult, Circumcision prior to discharge and Contraception (counseled)   LOS: 2 days   Jennifer Fisher 12/17/2019, 7:36 AM   I saw and evaluated the patient. I agree with the findings and the plan of care as documented in the resident's note. I consented patient at bedside for circumcision procedure using a Dari audio interpreter.  Casper Harrison, MD [facility] Family Medicine Fellow, [facility] for [facility], [facility]

## 2019-12-17 NOTE — Progress Notes (Signed)
CSW assisted [name] with setting up with [facility] CSW updated case worker Kenney Houseman of this. CSW assisted visitor in getting lunch as well. No other needs, CSW signing off.     Claude Manges Kiya Eno, MSW, LCSW [facility] [place] [phone]

## 2019-12-17 NOTE — Progress Notes (Signed)
CSW provided [name] with carseat for infant. Baby Bundle , as well as made Healthy Start referral at this time. CSW has been contact with Kenney Houseman (case manager) and was advised that sponsors are still assisting family with other needs. CSW notified of no other needs at this time and will assist family in obtaining WIC at best.     Claude Manges. Phyliss Hulick, MSW, LCSW [facility] [place] [phone]

## 2019-12-17 NOTE — Progress Notes (Addendum)
CSW has provided ([name] and guest) 5 meal tickets in total. CSW advised RN and Consulting civil engineer that CSW is unable to provide any further  meal tickets at this time due to amount already provided to guest. No further CSW needs, CSW signing off.     Claude Manges Bosco Paparella, MSW, LCSW [facility] [place] [phone]

## 2019-12-18 ENCOUNTER — Encounter (HOSPITAL_COMMUNITY): Payer: Self-pay | Admitting: Obstetrics and Gynecology

## 2019-12-18 ENCOUNTER — Other Ambulatory Visit (HOSPITAL_COMMUNITY): Payer: Self-pay | Admitting: Obstetrics and Gynecology

## 2019-12-18 DIAGNOSIS — Z30017 Encounter for initial prescription of implantable subdermal contraceptive: Secondary | ICD-10-CM

## 2019-12-18 HISTORY — PX: SUBDERMAL ETONOGESTREL IMPLANT INSERTION: PRO7525

## 2019-12-18 MED ORDER — ETONOGESTREL 68 MG ~~LOC~~ IMPL
68.0000 mg | DRUG_IMPLANT | Freq: Once | SUBCUTANEOUS | Status: AC
  Administered 2019-12-18: 68 mg via SUBCUTANEOUS
  Filled 2019-12-18: qty 1

## 2019-12-18 MED ORDER — LIDOCAINE HCL 1 % IJ SOLN
0.0000 mL | Freq: Once | INTRAMUSCULAR | Status: AC | PRN
  Administered 2019-12-18: 20 mL via INTRADERMAL
  Filled 2019-12-18: qty 20

## 2019-12-18 MED ORDER — PRENATAL MULTIVITAMIN CH
1.0000 | ORAL_TABLET | Freq: Every day | ORAL | 0 refills | Status: DC
Start: 2019-12-18 — End: 2020-08-12

## 2019-12-18 MED ORDER — INFLUENZA VAC A&B SA ADJ QUAD 0.5 ML IM PRSY
0.5000 mL | PREFILLED_SYRINGE | INTRAMUSCULAR | Status: DC
  Filled 2019-12-18: qty 0.5

## 2019-12-18 MED ORDER — IBUPROFEN 600 MG PO TABS
600.0000 mg | ORAL_TABLET | Freq: Four times a day (QID) | ORAL | 0 refills | Status: DC
Start: 2019-12-18 — End: 2019-12-18

## 2019-12-18 MED ORDER — ACETAMINOPHEN 325 MG PO TABS
650.0000 mg | ORAL_TABLET | ORAL | 0 refills | Status: DC | PRN
Start: 2019-12-18 — End: 2019-12-18

## 2019-12-18 MED ORDER — FERROUS SULFATE 325 (65 FE) MG PO TABS
325.0000 mg | ORAL_TABLET | Freq: Every day | ORAL | 0 refills | Status: DC
Start: 2019-12-18 — End: 2019-12-18

## 2019-12-18 MED ORDER — COCONUT OIL OIL
1.0000 | TOPICAL_OIL | 0 refills | Status: DC | PRN
Start: 2019-12-18 — End: 2020-08-12

## 2019-12-18 MED ORDER — ETONOGESTREL 68 MG ~~LOC~~ IMPL: 68.0000 mg | DRUG_IMPLANT | Freq: Once | SUBCUTANEOUS | 0 refills | Status: DC

## 2019-12-18 MED ORDER — SENNA 8.6 MG PO TABS: 1.0000 | ORAL_TABLET | Freq: Every day | ORAL | 0 refills | Status: DC | PRN

## 2019-12-18 MED ORDER — INFLUENZA VAC SPLIT QUAD 0.5 ML IM SUSY: 0.5000 mL | PREFILLED_SYRINGE | INTRAMUSCULAR | Status: DC

## 2019-12-18 MED FILL — ACETAMINOPHEN 325 MG TABS: 325 | 3 days supply | Qty: 30 | Fill #0

## 2019-12-18 MED FILL — FERROUS SULFATE 325 MG TAB: 325 (65 FE) | 60 days supply | Qty: 60 | Fill #0

## 2019-12-18 MED FILL — SENNA 8.6 MG TABS: 8.6 | 120 days supply | Qty: 120 | Fill #0

## 2019-12-18 MED FILL — PRENATAL 27-1 MG TABS: 27-1 | 90 days supply | Qty: 90 | Fill #0

## 2019-12-18 MED FILL — IBUPROFEN 600 MG TABLET: 600 | 4 days supply | Qty: 30 | Fill #0

## 2019-12-18 NOTE — Discharge Instructions (Signed)
Center for [facility] at [facility] for [facility]   Obstetrics and Gynecology [phone] [address] [place] [address]    Next Steps: Schedule an appointment as soon as possible for a visit in 4 week(s)    Instructions: Make an appointment at this clinic for your 4 week follow up appointment.      ?   4  ?   ? ~??~  ??.  [facility] Maternity Assessment Unit   Obstetrics and Gynecology [phone] [id number] [address] [id number] [facility] [place] [address]    Next Steps: Go to    Instructions: Return to the MAU if you develop any of the following: - fever of 100.4*F or higher - worsening bleeding  - worsening pain not relieved by tylenol or ibuprofen - feel unsafe caring for yourself or baby     [facility] Maternity Assessment Unit  ??   [phone] [address] [id number] [facility] ? [place] [address]  ?:     :     ?~   ?  MAU ?: -  100.4 * ? ?  - ? ?? -    ~  ? ? ? ~? ? ? -     ? ~~  ? ~?   Medications to take  ? ?   acetaminophen 325 MG tablet Commonly known as: Tylenol Take 2 tablets (650 mg total) by mouth every 4 (four) hours as needed for pain.   ? 325 MG   : Tylenol  ?  2  ( 650 ?? ) ~?   4 ()    ? ?   ~?. coconut oil  Apply 1 application topically as needed to nipples for skin irritation.   ? 1     ?   ? ? ~ ?  ? ?~  ?.  ferrous sulfate 325 (65 FE) MG tablet Take 1 tablet (325 mg total) by mouth daily with breakfast.    325 (65 FE) MG  1  ( 325 ?? ) ~?     .  ibuprofen 600 MG tablet Commonly known as:  ADVIL Take 1 tablet (600 mg total) by mouth every 6 (six) hours for pain.   ? 600    : ADVIL  ?  1  ( 600 ?? ) ~?  6 ()  ?   .  prenatal multivitamin Tabs tablet Take 1 tablet by mouth daily.  ? ?? Tabs     1  ~?  ~?.

## 2019-12-18 NOTE — Progress Notes (Signed)
CSW spoke with Rayna from [facility] and was advised that [facility] has been accepted and that card would be mailed to home soon. CSW understanding. CSW updated by RN that [name] reported that they have no food at home. CSW provided [name] with information for food pantry via [facility]. CSW advised [name] to sch. follow up appointment for self before going to the pantry as pantry is ONLY for pt's per staff at clinic. [name] reported that she understood and expressed no other needs to CSW.     Claude Manges Raef Sprigg, MSW, LCSW [facility] [place] [phone]

## 2019-12-18 NOTE — Procedures (Addendum)
  Procedure Note: Nexplanon insertion  Patient is a 29 y.o. [id number] now PPD# 2. She desires long-term reversible contraception.  Risks/benefits/side effects of Nexplanon have been discussed with patient and her questions have been answered. Patient is aware of the common side effect of irregular bleeding, which the incidence of decreases over time.  BP 112/87 (BP Location: Right Arm)   Pulse 64   Temp 98.2 F (36.8 C) (Oral)   Resp 18   Ht 4' 11.06" (1.5 m)   LMP 03/17/2019   SpO2 100%   Breastfeeding Unknown   She is left-handed, so her right arm, approximately 10 cm proximal from the elbow, was cleansed with alcohol and anesthetized with 1.5 mL of 1% Lidocaine.  The area was cleansed again with betadine and the Nexplanon was inserted per manufacturer's recommendations without difficulty.  A pressure bandage were applied.  Pt was instructed to keep the area clean and dry, remove pressure bandage in 24 hours.  She was given a card indicating date Nexplanon was inserted and date it needs to be removed. Follow-up PRN problems.  Lot #: [id number] Exp. Date: 05/21/22  Lavonda Jumbo, DO 12/18/2019, 12:14 PM PGY-2, [facility]  *Interpreter present for entire encounter  I agree with the findings and the plan of care as documented in the resident's note.  Casper Harrison, MD [facility] Fellow, [facility] for [facility], [facility]

## 2019-12-18 NOTE — Progress Notes (Signed)
CSW received call from Jennifer Fisher [phone] asking when [name] and infant would be  discharged. CSW advised Jennifer that no time has been set as of yet, however staff would gladly call Jennifer when they are ready. RN updated of this. CSW asked about meal ticket for guest in room, however CSW advised RN that CSW is not able to give any further meal tickets at this time due to amount already supplied.    Jennifer Buelow S. [name], MSW, LCSW [facility] [place] [phone]   

## 2019-12-18 NOTE — Progress Notes (Signed)
Called Kenney Houseman case manager Danelle Earthly Vie [phone] through [facility] and clarified she has a full medicaid application in process, not an emergency medicaid.

## 2019-12-26 ENCOUNTER — Encounter: Payer: Self-pay | Admitting: Obstetrics & Gynecology

## 2019-12-31 ENCOUNTER — Encounter (HOSPITAL_COMMUNITY): Payer: Self-pay | Admitting: Obstetrics & Gynecology

## 2020-01-01 ENCOUNTER — Encounter: Payer: Medicaid Other | Admitting: Obstetrics & Gynecology

## 2020-01-20 ENCOUNTER — Ambulatory Visit: Payer: Self-pay | Admitting: Family Medicine

## 2020-08-10 ENCOUNTER — Ambulatory Visit: Payer: Medicaid Other | Admitting: Neurology

## 2020-08-12 ENCOUNTER — Encounter: Payer: Self-pay | Admitting: Neurology

## 2020-08-12 ENCOUNTER — Ambulatory Visit: Payer: Medicaid Other | Admitting: Neurology

## 2020-08-12 VITALS — BP 108/64 | HR 64 | Ht 59.06 in | Wt 132.3 lb

## 2020-08-12 DIAGNOSIS — R519 Headache, unspecified: Secondary | ICD-10-CM

## 2020-08-12 DIAGNOSIS — G43019 Migraine without aura, intractable, without status migrainosus: Secondary | ICD-10-CM | POA: Diagnosis not present

## 2020-08-12 NOTE — Progress Notes (Signed)
Subjective:    Patient ID: Jennifer Fisher is a 30 y.o. female.  HPI    [name], MD, PhD [facility] [address], [address] [address] [place], [place] [address]  Dear [name],   I saw your patient, Jennifer Fisher, upon your kind request in my neurologic clinic today for initial consultation of her recurrent headaches.  The patient is accompanied by her husband, Kriste Basque and her 2 young children, Maralyn Sago and North, today.  Her husband is able to speak [place] and they were comfortable declining a language interpreter, none was available for today's visit and her husband facilitated translation (they speak [place] and [place] and I understand some words, as I speak Urdu).  As you know, [name] is a 30 year old right-handed woman with an underlying medical history of shoulder pain, anemia, DUB, and mildly overweight state, who reports recurrent headaches for the past nearly 2 years.  The headaches are one-sided, often throbbing, not typically associated with nausea or vomiting but often associated with light sensitivity.  She may have a headache 1 or 2 a week.  They can last up to 2 days.  She has been using ibuprofen, she takes 1 ibuprofen every night because she has leg pain and shoulder pain.  She has not been on any prescription medicine when she was in [place].  She did try paracetamol.  They moved from [place].  They have lived in [place] for about 10 months.  She does not smoke or drink alcohol, she does drink caffeine in the form of green tea, 1 to 2 glasses/day and likes to drink [facility], typically 1 bottle per day.  She does not currently work.   I reviewed your office note from 04/09/2020.  She has tried over-the-counter medication, no prescription medications identified for headaches. She denies any numbness or tingling.  She denies any weakness.  She denies visual symptoms such as blurry vision or loss of vision. She has never had a scan  but her husband would like to see if we can do an MRI. She is currently breast-feeding, she feeds typically just at night and during the day at her son gets the formula.  She is in the process of weaning him. She tries to hydrate well with water, he estimates that she drinks about 3 bottles of water per day.  Her Past Medical History Is Significant For: Past Medical History:  Diagnosis Date   Medical history non-contributory    per pt, using interpreter    Her Past Surgical History Is Significant For: Past Surgical History:  Procedure Laterality Date   APPENDECTOMY     NO PAST SURGERIES     SUBDERMAL ETONOGESTREL IMPLANT INSERTION  12/18/2019        Her Family History Is Significant For: History reviewed. No pertinent family history.  Her Social History Is Significant For: Social History   Socioeconomic History   Marital status: Married    Spouse name: Not on file   Number of children: Not on file   Years of education: Not on file   Highest education level: Not on file  Occupational History   Not on file  Tobacco Use   Smoking status: Never   Smokeless tobacco: Never  Vaping Use   Vaping Use: Never used  Substance and Sexual Activity   Alcohol use: Never   Drug use: Never   Sexual activity: Yes  Other Topics Concern   Not on file  Social History Narrative   **  Merged History Encounter **       Social Determinants of Health   Financial Resource Strain: Not on file  Food Insecurity: Not on file  Transportation Needs: Not on file  Physical Activity: Not on file  Stress: Not on file  Social Connections: Not on file    Her Allergies Are:  No Known Allergies:   Her Current Medications Are:  Outpatient Encounter Medications as of 08/12/2020  Medication Sig   ibuprofen (ADVIL) 600 MG tablet TAKE 1 TABLET (600 MG TOTAL) BY MOUTH EVERY SIX HOURS.   [DISCONTINUED] acetaminophen (TYLENOL) 325 MG tablet TAKE 2 TABLETS (650 MG TOTAL) BY MOUTH EVERY FOUR HOURS AS  NEEDED (FOR PAIN SCALE < 4).   [DISCONTINUED] coconut oil OIL Apply 1 application topically as needed.   [DISCONTINUED] ferrous sulfate 325 (65 FE) MG tablet Take 1 tablet every other day.   [DISCONTINUED] ferrous sulfate 325 (65 FE) MG tablet TAKE 1 TABLET (325 MG TOTAL) BY MOUTH DAILY WITH BREAKFAST.   [DISCONTINUED] Prenatal 27-1 MG TABS TAKE 1 TABLET BY MOUTH DAILY   [DISCONTINUED] Prenatal Vit-Fe Fumarate-FA (PRENATAL MULTIVITAMIN) TABS tablet Take 1 tablet by mouth daily.   [DISCONTINUED] Prenatal Vit-Fe Fumarate-FA (PRENATAL VITAMIN) 27-0.8 MG TABS Take 1 tablet by mouth daily.   [DISCONTINUED] senna (SENOKOT) 8.6 MG TABS tablet TAKE 1 TABLET (8.6 MG TOTAL) BY MOUTH DAILY AS NEEDED FOR MILD CONSTIPATION.   No facility-administered encounter medications on file as of 08/12/2020.  :   Review of Systems:  Out of a complete 14 point review of systems, all are reviewed and negative with the exception of these symptoms as listed below:  Review of Systems  Neurological:        Here for consult on worsening h/a. Reports she typically will take ibuprofen.  Would like to discuss possible MRI of the brain.    Objective:  Neurological Exam  Physical Exam Physical Examination:   Vitals:   08/12/20 0918  BP: 108/64  Pulse: 64    General Examination: The patient is a very pleasant 30 y.o. female in no acute distress. She appears well-developed and well-nourished and well groomed.   HEENT: Normocephalic, atraumatic, pupils are equal, round and reactive to light and accommodation. Extraocular tracking is good without limitation to gaze excursion or nystagmus noted. Normal smooth pursuit is noted. Hearing is grossly intact. Face is symmetric with normal facial animation and normal facial sensation. Speech is clear with no dysarthria noted. There is no hypophonia. There is no lip, neck/head, jaw or voice tremor. Neck is supple with full range of passive and active motion.   Chest: Clear to  auscultation without wheezing, rhonchi or crackles noted.  Heart: S1+S2+0, regular and normal without murmurs, rubs or gallops noted.   Abdomen: Soft, non-tender and non-distended with normal bowel sounds appreciated on auscultation.  Extremities: There is no pitting edema in the distal lower extremities bilaterally. Pedal pulses are intact.  Skin: Warm and dry without trophic changes noted.  Musculoskeletal: exam reveals no obvious joint deformities, tenderness or joint swelling or erythema.   Neurologically:  Mental status: The patient is awake, alert and oriented in all 4 spheres. Her immediate and remote memory, attention, language skills and fund of knowledge are appropriate. There is no evidence of aphasia, agnosia, apraxia or anomia. Speech is clear with normal prosody and enunciation. Thought process is linear. Mood is normal and affect is normal.  Cranial nerves II - XII are as described above under HEENT exam. In addition:  shoulder shrug is normal with equal shoulder height noted. Motor exam: Normal bulk, strength and tone is noted. There is no drift, tremor or rebound. Romberg is negative. Reflexes are 2+ throughout. Babinski: Toes are flexor bilaterally. Fine motor skills and coordination: intact with normal finger taps, normal hand movements, normal rapid alternating patting, normal foot taps and normal foot agility.  Cerebellar testing: No dysmetria or intention tremor on finger to nose testing. Heel to shin is unremarkable bilaterally. There is no truncal or gait ataxia.  Sensory exam: intact to light touch, vibration, temperature sense in the upper and lower extremities.  Gait, station and balance: She stands easily. No veering to one side is noted. No leaning to one side is noted. Posture is age-appropriate and stance is narrow based. Gait shows normal stride length and normal pace. No problems turning are noted. Tandem walk is unremarkable.   Assessment and Plan:   In summary,  Jannae Fagerstrom is a very pleasant 30 y.o.-year old female with an underlying medical history of shoulder pain, anemia, DUB, and mildly overweight state, who presents for evaluation of her recurrent headaches of approximately 2 years duration.  History and description of the headache is supportive of migraine headaches without aura.  She has a nonfocal neurological exam which is reassuring.  Nevertheless, she has never had a brain scan, her husband is hoping that we can offer a brain scan to rule out a structural cause.  I do believe this is reasonable since she has never had a scan.  She is advised to proceed with a brain MRI with and without contrast.  We talked about preventative medications.  I would suggest amitriptyline as a first trial but would like to hold off until she is able to completely wean off of breast-feeding.  She is currently breast-feeding her baby boy at night only.  He gets a formula during the day.  She is willing to wait until she is completely finished with breast-feeding.  I think it would be safest.  We talked about headache triggers including dehydration, sleep deprivation, stress etc.  She is advised to try to work on those triggers as best as possible.  She does like to drink some caffeine.  She is advised to try to limit herself when it comes to soda intake.  She also drinks some green tea but overall tries to hydrate well with water.  She is encouraged to try for as needed use ibuprofen and Tylenol.  We will plan to follow-up in this clinic for her to see one of our nurse practitioners in about 3 months.  We will go ahead with the MRI and keep him posted as to the results by phone call, her husband is able to speak some [place].  I answered all her questions today and the patient and her husband were in agreement with the plan.   Thank you very much for allowing me to participate in the care of this nice patient. If I can be of any further assistance to you please do not hesitate  to call me at [phone].  Sincerely,   [name], MD, PhD

## 2020-08-12 NOTE — Patient Instructions (Addendum)
It was nice to meet you both today.  Your neurological exam is normal thankfully.  Since you are still breast-feeding, I would like to hold off on the prescription medicine that we can consider a medicine for headache prevention in the near future once you have weaned off of breast-feeding. We can consider a medication called Elavil (generic name: amitriptyline) 25 mg: Take half a pill daily at bedtime for one week, then one pill daily at bedtime for one week, then one and a half pills daily at bedtime for one week, then 2 pills daily at bedtime thereafter. Common side effects reported are: mouth dryness, drowsiness, confusion, dizziness.  I have not prescribed it yet, as discussed, we will wait and see you you have stopped breast-feeding.   For now, you can use as needed Tylenol or ibuprofen.  Please try to reduce your soda intake, [facility] contains quite a lot of caffeine which can make your headaches worse.  Please try to drink a lot of water, 3-4 bottles per day are recommended, 16.9 ounce size.  As discussed, I will order a brain MRI.  We will call you to schedule this test and also with the results.  Please follow-up in this clinic to see one of our nurse practitioners in about 3 months.

## 2020-08-13 LAB — COMPREHENSIVE METABOLIC PANEL
ALT: 21 IU/L (ref 0–32)
AST: 14 IU/L (ref 0–40)
Albumin/Globulin Ratio: 2.1 (ref 1.2–2.2)
Albumin: 5 g/dL (ref 3.9–5.0)
Alkaline Phosphatase: 85 IU/L (ref 44–121)
BUN/Creatinine Ratio: 13 (ref 9–23)
BUN: 8 mg/dL (ref 6–20)
Bilirubin Total: 0.3 mg/dL (ref 0.0–1.2)
CO2: 20 mmol/L (ref 20–29)
Calcium: 9.5 mg/dL (ref 8.7–10.2)
Chloride: 104 mmol/L (ref 96–106)
Creatinine, Ser: 0.61 mg/dL (ref 0.57–1.00)
Globulin, Total: 2.4 g/dL (ref 1.5–4.5)
Glucose: 98 mg/dL (ref 65–99)
Potassium: 3.8 mmol/L (ref 3.5–5.2)
Sodium: 140 mmol/L (ref 134–144)
Total Protein: 7.4 g/dL (ref 6.0–8.5)
eGFR: 126 mL/min/{1.73_m2} (ref >59)

## 2020-08-18 ENCOUNTER — Telehealth: Payer: Self-pay

## 2020-08-18 ENCOUNTER — Telehealth: Payer: Self-pay | Admitting: Neurology

## 2020-08-18 NOTE — Telephone Encounter (Signed)
-----   Message from [name], MD sent at 08/17/2020  1:35 PM EDT ----- Labs are fine, please notify husband who is [place] speaking.  Okay to proceed with brain MRI as discussed.

## 2020-08-18 NOTE — Telephone Encounter (Signed)
Spoke to patients husband. Gave results and explained we are still waiting on insurance approval for her MRI. Once approved [facility] will call them. Pt verbalized understanding and had no further questions at this time.

## 2020-08-18 NOTE — Telephone Encounter (Addendum)
MRI brain w/wo contrast is currently pending with insurance [facility]). Reference- [id number]. Tracking #[id number].

## 2020-08-18 NOTE — Progress Notes (Signed)
Called and spoke to patients wife. Explained results and explained that we are still waiting got insurance to approve her MRI and that [facility] will call them. Husband verbalized understanding and had no further questions at this time.

## 2020-08-24 NOTE — Telephone Encounter (Signed)
Received approval from [facility] #[id number] (08/18/20- 10/17/20). Sent order to GI. They will call patient to schedule.

## 2020-09-08 ENCOUNTER — Ambulatory Visit
Admission: RE | Admit: 2020-09-08 | Discharge: 2020-09-08 | Disposition: A | Discharge status: Home / Self Care | Payer: Medicaid Other | Source: Ambulatory Visit | Attending: Neurology | Admitting: Neurology

## 2020-09-08 DIAGNOSIS — R519 Headache, unspecified: Secondary | ICD-10-CM

## 2020-09-08 DIAGNOSIS — G43019 Migraine without aura, intractable, without status migrainosus: Secondary | ICD-10-CM

## 2020-09-08 MED ORDER — GADOBENATE DIMEGLUMINE 529 MG/ML IV SOLN
16.0000 mL | Freq: Once | INTRAVENOUS | Status: AC | PRN
  Administered 2020-09-08: 16 mL via INTRAVENOUS

## 2020-09-14 ENCOUNTER — Telehealth: Payer: Self-pay | Admitting: *Deleted

## 2020-09-14 NOTE — Telephone Encounter (Signed)
I called using language interpreters Osborne Casco [phone] and LMVM for both home #'s to return call for test results (MRI).

## 2020-09-14 NOTE — Telephone Encounter (Signed)
-----   Message from [name], MD sent at 09/12/2020  4:18 PM EDT ----- Interpreter needed.  Please call patient and advise her that her brain MRI with and without contrast from 09/08/2020 was reported as normal.  As discussed, please ask her to follow-up with one of our nurse practitioners in about 3 months.

## 2020-09-20 ENCOUNTER — Ambulatory Visit: Payer: Medicaid Other | Admitting: Neurology

## 2020-09-23 ENCOUNTER — Telehealth: Payer: Self-pay | Admitting: *Deleted

## 2020-09-23 NOTE — Telephone Encounter (Signed)
I spoke to the patient's husband who speaks english (on [place]). He verbalized understanding of the findings. The patient had an appt w/ Megan on 11/26/20. Her husband states she is likely going to lose her medical coverage w/ Medicaid at the end of September. He is asking for an earlier follow up. Aundra Millet authorized [place] to use a held slot on 10/21/20 at 11am (check-in at 10:30am).

## 2020-09-23 NOTE — Telephone Encounter (Signed)
-----   Message from Saima Athar, MD sent at 09/12/2020  4:18 PM EDT ----- Interpreter needed.  Please call patient and advise her that her brain MRI with and without contrast from 09/08/2020 was reported as normal.  As discussed, please ask her to follow-up with one of our nurse practitioners in about 3 months. 

## 2020-10-21 ENCOUNTER — Ambulatory Visit: Payer: Medicaid Other | Admitting: Adult Health

## 2020-11-03 NOTE — Congregational Nurse Program (Signed)
  Dept: [phone]   Congregational Nurse Program Note  Date of Encounter: 11/03/2020  Past Medical History: Past Medical History:  Diagnosis Date   Medical history non-contributory    per pt, using interpreter    Encounter Details:  Patient is complaining of cough with congestion. No fever. Advised on home remedies and/or over the counter medication. Patient to seek medical attention if experiencing SOB or chest pain.  Arman Bogus RN BSn PCCN  [facility]regational Nurse [phone]-cell [phone]-office

## 2020-11-24 ENCOUNTER — Ambulatory Visit: Payer: Medicaid Other | Admitting: Adult Health

## 2021-11-08 NOTE — Progress Notes (Signed)
   GYNECOLOGY OFFICE VISIT NOTE  History:   Ineze Fisher is a 31 y.o. [id number] here today for consult for sterilization.   She is sure she is done with child bearing. She does not want any more children under any circumstance.   She is currently using Nexplanon for birth control. She is bleeding all the time on it.      Past Medical History:  Diagnosis Date   Medical history non-contributory    per pt, using interpreter    Past Surgical History:  Procedure Laterality Date   APPENDECTOMY     NO PAST SURGERIES     SUBDERMAL ETONOGESTREL IMPLANT INSERTION  12/18/2019        The following portions of the patient's history were reviewed and updated as appropriate: allergies, current medications, past family history, past medical history, past social history, past surgical history and problem list.   Health Maintenance:   None on file.   Review of Systems:  Pertinent items noted in HPI and remainder of comprehensive ROS otherwise negative.  Physical Exam:  BP (!) 142/88   Pulse (!) 108   Wt 142 lb 14.4 oz (64.8 kg)   Breastfeeding No   BMI 28.81 kg/m  CONSTITUTIONAL: Well-developed, well-nourished female in no acute distress.  HEENT:  Normocephalic, atraumatic. External right and left ear normal. No scleral icterus.  NECK: Normal range of motion, supple, no masses noted on observation SKIN: No rash noted. Not diaphoretic. No erythema. No pallor. MUSCULOSKELETAL: Normal range of motion. No edema noted. NEUROLOGIC: Alert and oriented to person, place, and time. Normal muscle tone coordination. No cranial nerve deficit noted. PSYCHIATRIC: Normal mood and affect. Normal behavior. Normal judgment and thought content.  PELVIC: Deferred  Labs and Imaging No results found for this or any previous visit (from the past 168 hour(s)). No results found.  Assessment and Plan:   1. Unwanted fertility - She desires permanent sterilization. Discussed alternatives including LARC  options and vasectomy. She declines these options.  - Discussed surgery of salpingectomy vs tubal ligation. She would like to do a salpingectomy.  - Risks of surgery include but are not limited to: bleeding, infection, injury to surrounding organs/tissues (i.e. bowel/bladder/ureters), need for additional procedures, wound complications, hospital re-admission, and conversion to open surgery, VTE - Reviewed restrictions and recovery following surgery - State papers signed today   Breakthrough bleeding on Nexplanon -     Drospirenone (SLYND) 4 MG TABS; Take 1 tablet by mouth daily. - Discussed would keep Nexplanon until surgery and we will remove once tubal completed. Pt amenable to this. Will try POP until that time to help with BTB.   In-person interpreter used throughout her appt.    Routine preventative health maintenance measures emphasized. Please refer to After Visit Summary for other counseling recommendations.   Return in about 3 months (around 02/10/2022) for annual.  Jennifer Gunning, MD, [place] for [facility], [place]

## 2021-11-11 ENCOUNTER — Other Ambulatory Visit (HOSPITAL_COMMUNITY): Payer: Self-pay

## 2021-11-11 ENCOUNTER — Ambulatory Visit (INDEPENDENT_AMBULATORY_CARE_PROVIDER_SITE_OTHER): Payer: Medicaid Other | Admitting: Obstetrics and Gynecology

## 2021-11-11 ENCOUNTER — Encounter: Payer: Self-pay | Admitting: Obstetrics and Gynecology

## 2021-11-11 VITALS — BP 142/88 | HR 108 | Wt 142.9 lb

## 2021-11-11 DIAGNOSIS — Z975 Presence of (intrauterine) contraceptive device: Secondary | ICD-10-CM

## 2021-11-11 DIAGNOSIS — N921 Excessive and frequent menstruation with irregular cycle: Secondary | ICD-10-CM

## 2021-11-11 DIAGNOSIS — Z3009 Encounter for other general counseling and advice on contraception: Secondary | ICD-10-CM

## 2021-11-11 MED ORDER — SLYND 4 MG PO TABS
1.0000 | ORAL_TABLET | Freq: Every day | ORAL | 0 refills | Status: DC
  Filled 2021-11-11: qty 84, fill #0

## 2022-01-03 ENCOUNTER — Other Ambulatory Visit (HOSPITAL_COMMUNITY): Payer: Self-pay

## 2022-01-03 ENCOUNTER — Ambulatory Visit: Payer: Medicaid Other | Admitting: Obstetrics and Gynecology

## 2022-01-03 ENCOUNTER — Encounter: Payer: Self-pay | Admitting: Obstetrics and Gynecology

## 2022-01-03 VITALS — BP 116/84 | HR 97 | Wt 139.3 lb

## 2022-01-03 DIAGNOSIS — Z3046 Encounter for surveillance of implantable subdermal contraceptive: Secondary | ICD-10-CM | POA: Diagnosis not present

## 2022-01-03 DIAGNOSIS — Z3009 Encounter for other general counseling and advice on contraception: Secondary | ICD-10-CM

## 2022-01-03 MED ORDER — NORGESTIMATE-ETH ESTRADIOL 0.25-35 MG-MCG PO TABS
1.0000 | ORAL_TABLET | Freq: Every day | ORAL | 11 refills | Status: DC
  Filled 2022-01-03: qty 30, 30d supply, fill #0

## 2022-01-04 NOTE — Progress Notes (Signed)
GYNECOLOGY VISIT  Patient name: Jennifer Fisher MRN [id number]  Date of birth: April 09, 1990 Chief Complaint:   Nexplanon removal   History:  Jennifer Fisher is a 31 y.o. [id number] being seen today for nexplanon removal and discussion of sterilization.     Past Medical History:  Diagnosis Date   Medical history non-contributory    per pt, using interpreter    Past Surgical History:  Procedure Laterality Date   APPENDECTOMY     NO PAST SURGERIES     SUBDERMAL ETONOGESTREL IMPLANT INSERTION  12/18/2019        The following portions of the patient's history were reviewed and updated as appropriate: allergies, current medications, past family history, past medical history, past social history, past surgical history and problem list.     Review of Systems:  Pertinent items are noted in HPI. Comprehensive review of systems was otherwise negative.   Objective:  Physical Exam BP 116/84   Pulse 97   Wt 139 lb 4.8 oz (63.2 kg)   BMI 28.08 kg/m    Physical Exam Vitals and nursing note reviewed. Exam conducted with a chaperone present.  Constitutional:      Appearance: Normal appearance.  HENT:     Head: Normocephalic and atraumatic.  Cardiovascular:     Rate and Rhythm: Normal rate and regular rhythm.  Pulmonary:     Effort: Pulmonary effort is normal.     Breath sounds: Normal breath sounds.  Skin:    General: Skin is warm and dry.  Neurological:     General: No focal deficit present.     Mental Status: She is alert.  Psychiatric:        Mood and Affect: Mood normal.        Behavior: Behavior normal.        Thought Content: Thought content normal.        Judgment: Judgment normal.    NEXPLANON REMOVAL The risks (including infection, bleeding, pain, and uterine perforation) and benefits of the procedure were explained to the patient and Written informed consent was obtained.   Device was palpated in left upper arm. After time out, the skin was cleaned with  alcohol and infiltrated with 1cc of 1% lidocaine with epinephrine was used to infiltrate the skin and subcutaneous tissue deep to the device. The area was cleaned with betadine x3.  Using an 11 blade, the skin was incised and the implant was removed intact.  The implant was shown to the patient.  The skin was cleaned, incision covered with Steri-Strips, and an adhesive bandage.  Arm was wrapped and post procedure instructions provided to the patient.  OCPs chosen as new contraceptive method.   Labs and Imaging No results found.     Assessment & Plan:   1. Encounter for other general counseling or advice on contraception Now s/p uncomplicated nexplanon removal. Opted for OCPs for interim contraception until sterilization.  - norgestimate-ethinyl estradiol (ORTHO-CYCLEN) 0.25-35 MG-MCG tablet; Take 1 tablet by mouth daily.  Dispense: 30 tablet; Refill: 11  Patient desires surgical management with laparoscopic bilateral salpingecotmy.  The risks of surgery were discussed in detail with the patient including but not limited to: bleeding which may require transfusion or reoperation; infection which may require prolonged hospitalization or re-hospitalization and antibiotic therapy; injury to bowel, bladder, ureters and major vessels or other surrounding organs which may lead to other procedures; formation of adhesions; need for additional procedures including laparotomy or subsequent procedures secondary to intraoperative injury or abnormal  pathology; thromboembolic phenomenon; incisional problems and other postoperative or anesthesia complications.  Patient was told that the likelihood that her condition and symptoms will be treated effectively with this surgical management was very high; the postoperative expectations were also discussed in detail. The patient also understands the alternative treatment options which were discussed in full. All questions were answered.  She was told that she will be  contacted by our surgical scheduler regarding the time and date of her surgery; routine preoperative instructions will be given to her by the preoperative nursing team.  Printed patient education handouts about the procedure were given to the patient to review at home.    Routine preventative health maintenance measures emphasized.  Darliss Cheney, MD [facility] for [place]

## 2022-01-16 ENCOUNTER — Encounter (HOSPITAL_COMMUNITY): Payer: Self-pay | Admitting: *Deleted

## 2022-01-16 NOTE — Progress Notes (Signed)
Spoke w/ via phone for pre-op interview---  pt thru [place] [place] interpreter ID # [id number] Lab needs dos---- cbc, t&s, urine preg            Lab results------ no COVID test -----patient states asymptomatic no test needed Arrive at ------- 1230 on 01-18-2022 NPO after MN NO Solid Food.  Clear liquids from MN until--- 0800 (water/ gaterade) Med rec completed Medications to take morning of surgery ----- none Diabetic medication ----- n/a Patient instructed no nail polish to be worn day of surgery Patient instructed to bring photo id and insurance card day of surgery Patient aware to have Driver (ride ) / caregiver for 24 hours after surgery --- husband, Ramatullah Patient Special Instructions ----- n/a Pre-Op special Istructions ----- requested [place] interpreter via email to Center interpreting. Patient verbalized understanding of instructions that were given at this phone interview. Patient denies shortness of breath, chest pain, fever, cough at this phone interview.

## 2022-01-18 ENCOUNTER — Other Ambulatory Visit: Payer: Self-pay

## 2022-01-18 ENCOUNTER — Encounter (HOSPITAL_BASED_OUTPATIENT_CLINIC_OR_DEPARTMENT_OTHER)
Admission: RE | Disposition: A | Discharge status: Home / Self Care | Payer: Self-pay | Source: Home / Self Care | Attending: Obstetrics and Gynecology

## 2022-01-18 ENCOUNTER — Ambulatory Visit (HOSPITAL_BASED_OUTPATIENT_CLINIC_OR_DEPARTMENT_OTHER): Payer: Medicaid Other | Admitting: Anesthesiology

## 2022-01-18 ENCOUNTER — Ambulatory Visit (HOSPITAL_BASED_OUTPATIENT_CLINIC_OR_DEPARTMENT_OTHER)
Admission: RE | Admit: 2022-01-18 | Discharge: 2022-01-18 | Disposition: A | Discharge status: Home / Self Care | Payer: Medicaid Other | Attending: Obstetrics and Gynecology | Admitting: Obstetrics and Gynecology

## 2022-01-18 ENCOUNTER — Encounter (HOSPITAL_BASED_OUTPATIENT_CLINIC_OR_DEPARTMENT_OTHER): Payer: Self-pay | Admitting: Obstetrics and Gynecology

## 2022-01-18 DIAGNOSIS — Z3009 Encounter for other general counseling and advice on contraception: Secondary | ICD-10-CM | POA: Insufficient documentation

## 2022-01-18 DIAGNOSIS — Z302 Encounter for sterilization: Secondary | ICD-10-CM | POA: Insufficient documentation

## 2022-01-18 DIAGNOSIS — Z01818 Encounter for other preprocedural examination: Secondary | ICD-10-CM

## 2022-01-18 DIAGNOSIS — N838 Other noninflammatory disorders of ovary, fallopian tube and broad ligament: Secondary | ICD-10-CM | POA: Insufficient documentation

## 2022-01-18 HISTORY — PX: LAPAROSCOPIC BILATERAL SALPINGECTOMY: SHX5889

## 2022-01-18 HISTORY — DX: Other seasonal allergic rhinitis: J30.2

## 2022-01-18 LAB — POCT PREGNANCY, URINE: Preg Test, Ur: NEGATIVE

## 2022-01-18 SURGERY — SALPINGECTOMY, BILATERAL, LAPAROSCOPIC
Anesthesia: General | Site: Uterus | Laterality: Bilateral

## 2022-01-18 MED ORDER — SCOPOLAMINE 1 MG/3DAYS TD PT72
1.0000 | MEDICATED_PATCH | TRANSDERMAL | Status: DC
  Administered 2022-01-18: 1.5 mg via TRANSDERMAL

## 2022-01-18 MED ORDER — DEXAMETHASONE SODIUM PHOSPHATE 4 MG/ML IJ SOLN
INTRAMUSCULAR | Status: DC | PRN
  Administered 2022-01-18: 8 mg via INTRAVENOUS

## 2022-01-18 MED ORDER — PROMETHAZINE HCL 25 MG/ML IJ SOLN: 6.2500 mg | INTRAMUSCULAR | Status: DC | PRN

## 2022-01-18 MED ORDER — SUGAMMADEX SODIUM 200 MG/2ML IV SOLN
INTRAVENOUS | Status: DC | PRN
  Administered 2022-01-18: 200 mg via INTRAVENOUS
  Administered 2022-01-18: 50 mg via INTRAVENOUS

## 2022-01-18 MED ORDER — ACETAMINOPHEN 500 MG PO TABS
ORAL_TABLET | ORAL | Status: AC
  Filled 2022-01-18: qty 2

## 2022-01-18 MED ORDER — LIDOCAINE HCL (CARDIAC) PF 100 MG/5ML IV SOSY
PREFILLED_SYRINGE | INTRAVENOUS | Status: DC | PRN
  Administered 2022-01-18: 50 mg via INTRAVENOUS

## 2022-01-18 MED ORDER — ACETAMINOPHEN 160 MG/5ML PO SOLN: 325.0000 mg | ORAL | Status: DC | PRN

## 2022-01-18 MED ORDER — OXYCODONE HCL 5 MG/5ML PO SOLN: 5.0000 mg | Freq: Once | ORAL | Status: DC | PRN

## 2022-01-18 MED ORDER — 0.9 % SODIUM CHLORIDE (POUR BTL) OPTIME
TOPICAL | Status: DC | PRN
  Administered 2022-01-18: 500 mL

## 2022-01-18 MED ORDER — POVIDONE-IODINE 10 % EX SWAB: 2.0000 | Freq: Once | CUTANEOUS | Status: DC

## 2022-01-18 MED ORDER — FENTANYL CITRATE (PF) 100 MCG/2ML IJ SOLN
INTRAMUSCULAR | Status: AC
  Filled 2022-01-18: qty 2

## 2022-01-18 MED ORDER — BUPIVACAINE HCL (PF) 0.25 % IJ SOLN
INTRAMUSCULAR | Status: DC | PRN
  Administered 2022-01-18: 10 mL

## 2022-01-18 MED ORDER — ONDANSETRON HCL 4 MG/2ML IJ SOLN
INTRAMUSCULAR | Status: DC | PRN
  Administered 2022-01-18: 4 mg via INTRAVENOUS

## 2022-01-18 MED ORDER — PROPOFOL 10 MG/ML IV BOLUS
INTRAVENOUS | Status: DC | PRN
  Administered 2022-01-18: 140 mg via INTRAVENOUS

## 2022-01-18 MED ORDER — ROCURONIUM BROMIDE 100 MG/10ML IV SOLN
INTRAVENOUS | Status: DC | PRN
  Administered 2022-01-18: 50 mg via INTRAVENOUS

## 2022-01-18 MED ORDER — OXYCODONE HCL 5 MG PO TABS: 5.0000 mg | ORAL_TABLET | ORAL | 0 refills | Status: DC | PRN

## 2022-01-18 MED ORDER — KETOROLAC TROMETHAMINE 30 MG/ML IJ SOLN
INTRAMUSCULAR | Status: DC | PRN
  Administered 2022-01-18: 30 mg via INTRAVENOUS

## 2022-01-18 MED ORDER — ACETAMINOPHEN 10 MG/ML IV SOLN: 1000.0000 mg | Freq: Once | INTRAVENOUS | Status: DC | PRN

## 2022-01-18 MED ORDER — FENTANYL CITRATE (PF) 100 MCG/2ML IJ SOLN
INTRAMUSCULAR | Status: DC | PRN
  Administered 2022-01-18: 25 ug via INTRAVENOUS
  Administered 2022-01-18: 50 ug via INTRAVENOUS
  Administered 2022-01-18: 25 ug via INTRAVENOUS

## 2022-01-18 MED ORDER — LACTATED RINGERS IV SOLN: INTRAVENOUS | Status: DC

## 2022-01-18 MED ORDER — ACETAMINOPHEN 500 MG PO TABS: 1000.0000 mg | ORAL_TABLET | ORAL | Status: DC

## 2022-01-18 MED ORDER — ACETAMINOPHEN 500 MG PO TABS
1000.0000 mg | ORAL_TABLET | ORAL | Status: AC
  Administered 2022-01-18: 1000 mg via ORAL

## 2022-01-18 MED ORDER — MIDAZOLAM HCL 2 MG/2ML IJ SOLN
INTRAMUSCULAR | Status: DC | PRN
  Administered 2022-01-18: 2 mg via INTRAVENOUS

## 2022-01-18 MED ORDER — FENTANYL CITRATE (PF) 100 MCG/2ML IJ SOLN
25.0000 ug | INTRAMUSCULAR | Status: DC | PRN
  Administered 2022-01-18: 25 ug via INTRAVENOUS

## 2022-01-18 MED ORDER — KETOROLAC TROMETHAMINE 15 MG/ML IJ SOLN: 15.0000 mg | INTRAMUSCULAR | Status: DC

## 2022-01-18 MED ORDER — ACETAMINOPHEN 325 MG PO TABS: 325.0000 mg | ORAL_TABLET | ORAL | Status: DC | PRN

## 2022-01-18 MED ORDER — SCOPOLAMINE 1 MG/3DAYS TD PT72
MEDICATED_PATCH | TRANSDERMAL | Status: AC
  Filled 2022-01-18: qty 1

## 2022-01-18 MED ORDER — MIDAZOLAM HCL 2 MG/2ML IJ SOLN
INTRAMUSCULAR | Status: AC
  Filled 2022-01-18: qty 2

## 2022-01-18 MED ORDER — OXYCODONE HCL 5 MG PO TABS: 5.0000 mg | ORAL_TABLET | Freq: Once | ORAL | Status: DC | PRN

## 2022-01-18 SURGICAL SUPPLY — 24 items
ADH SKN CLS APL DERMABOND .7 (GAUZE/BANDAGES/DRESSINGS) ×1
COVER MAYO STAND STRL (DRAPES) ×1 IMPLANT
DERMABOND ADVANCED .7 DNX12 (GAUZE/BANDAGES/DRESSINGS) ×1 IMPLANT
DURAPREP 26ML APPLICATOR (WOUND CARE) ×1 IMPLANT
GLOVE BIO SURGEON STRL SZ 6 (GLOVE) ×1 IMPLANT
GOWN STRL REUS W/TWL LRG LVL3 (GOWN DISPOSABLE) ×1 IMPLANT
IRRIG SUCT STRYKERFLOW 2 WTIP (MISCELLANEOUS)
IRRIGATION SUCT STRKRFLW 2 WTP (MISCELLANEOUS) IMPLANT
KIT TURNOVER CYSTO (KITS) ×1 IMPLANT
NDL INSUFFLATION 14GA 120MM (NEEDLE) ×1 IMPLANT
NEEDLE INSUFFLATION 14GA 120MM (NEEDLE) ×1 IMPLANT
PACK LAPAROSCOPY BASIN (CUSTOM PROCEDURE TRAY) ×1 IMPLANT
PACK TRENDGUARD 450 HYBRID PRO (MISCELLANEOUS) ×1 IMPLANT
SEALER TISSUE G2 CVD JAW 35 (ENDOMECHANICALS) ×1 IMPLANT
SEALER TISSUE G2 CVD JAW 45CM (ENDOMECHANICALS) ×2
SET IRRIG Y TYPE TUR BLADDER L (SET/KITS/TRAYS/PACK) IMPLANT
SET TUBE SMOKE EVAC HIGH FLOW (TUBING) ×1 IMPLANT
SLEEVE Z-THREAD 5X100MM (TROCAR) ×1 IMPLANT
SUT VICRYL 4-0 PS2 18IN ABS (SUTURE) ×1 IMPLANT
TOWEL OR 17X26 10 PK STRL BLUE (TOWEL DISPOSABLE) ×1 IMPLANT
TRENDGUARD 450 HYBRID PRO PACK (MISCELLANEOUS) ×1
TROCAR KII 8X100ML NONTHREADED (TROCAR) IMPLANT
TROCAR Z-THREAD FIOS 5X100MM (TROCAR) ×1 IMPLANT
WARMER LAPAROSCOPE (MISCELLANEOUS) ×1 IMPLANT

## 2022-01-18 NOTE — Op Note (Signed)
Carlye Grippe PROCEDURE DATE: 01/18/2022   PREOPERATIVE DIAGNOSIS:  Undesired fertility  POSTOPERATIVE DIAGNOSIS:  Undesired fertility  PROCEDURE:  Laparoscopic Bilateral Salpingectomy   SURGEON:  Dr. Milas Hock  ASSISTANT:  Dr. Harvie Bridge. An experienced assistant was required given the standard of surgical care given the complexity of the case.  This assistant was needed for exposure, dissection, suctioning, retraction, instrument exchange, and for overall help during the procedure.  ANESTHESIA:  General endotracheal  COMPLICATIONS:  None immediate.  ESTIMATED BLOOD LOSS:  5 ml.  FLUIDS: 700 ml LR.  URINE OUTPUT:  not collected  INDICATIONS: 31 y.o. [id number] with undesired fertility, desires permanent sterilization.  Other forms of contraception were discussed with patient and emphasized alternatives of vasectomy, IUDs and Nexplanon as they have equivalent contraceptive efficacy; she declines all other modalities.  Risks of procedure discussed with patient including permanence of method, risk of regret, bleeding, infection, injury to surrounding organs and need for additional procedures including laparotomy.  Failure risk less than 0.5% with increased risk of ectopic gestation if pregnancy occurs was also discussed with patient.  Written informed consent was obtained.    FINDINGS:  Normal uterus, fallopian tubes, and right ovary. Left ovarian cyst.   TECHNIQUE:  After all consents were signed and questions were answered the patient was taken to the operating room where general anesthesia was induced. She was placed in dorsal lithotomy position. A catheter was used to drain her bladder. She was prepped and draped in the usual sterile fashion in the dorsal supine position. A timeout was performed.  A 70mm skin incision was made in the umbilicus. The abdomen was entered with a Veress needle. Drop test was normal. Opening pressure was 6. The abdomen was insufflated . The area  under the point of entry was carefully inspected and no injury was noted. The patient was then placed in Trendelenburg position.  The scalpel was then used to make a 86mm incsions the LLQ and an 64mm suprapubic incision. 63mm and 54mm optiview ports were inserted respectively. The fallopian tubes were cauterized, cut, and detached from their surrounding pelvic structures with the enseal. No bleeding was noted. The specimens were placed in the anterior cul de sac initially, then withdrawn through the 61mm ports. The pelvis was inspected and was hemostatic. All ports were then withdrawn and the gas drained from abdomen. They were then closed in a subcuticular fashion with 4-0 vicryl and Dermabond was placed.  The patient will be discharged to home as per PACU criteria.  Routine postoperative instructions given.  She will follow up in the office in 4 wks for postoperative evaluation.  Harvie Bridge, MD Obstetrician & Gynecologist, [facility] for [employer], [facility]

## 2022-01-18 NOTE — Anesthesia Procedure Notes (Signed)
Procedure Name: Intubation Date/Time: 01/18/2022 1:47 PM  Performed by: Georgeanne Nim, CRNAPre-anesthesia Checklist: Patient identified, Emergency Drugs available, Suction available, Patient being monitored and Timeout performed Patient Re-evaluated:Patient Re-evaluated prior to induction Oxygen Delivery Method: Circle system utilized Preoxygenation: Pre-oxygenation with 100% oxygen Induction Type: IV induction Ventilation: Mask ventilation without difficulty Laryngoscope Size: Mac and 3 Grade View: Grade I Tube type: Oral Tube size: 7.0 mm Number of attempts: 1 Airway Equipment and Method: Stylet Placement Confirmation: positive ETCO2, breath sounds checked- equal and bilateral and ETT inserted through vocal cords under direct vision Secured at: 21 cm Tube secured with: Tape Dental Injury: Teeth and Oropharynx as per pre-operative assessment

## 2022-01-18 NOTE — Transfer of Care (Signed)
Immediate Anesthesia Transfer of Care Note  Patient: Jennifer Fisher  Procedure(s) Performed: LAPAROSCOPIC BILATERAL SALPINGECTOMY (Bilateral: Uterus)  Patient Location: PACU  Anesthesia Type:General  Level of Consciousness: awake and patient cooperative  Airway & Oxygen Therapy: Patient Spontanous Breathing and Patient connected to nasal cannula oxygen  Post-op Assessment: Report given to RN and Post -op Vital signs reviewed and stable  Post vital signs: Reviewed and stable  Last Vitals:  Vitals Value Taken Time  BP 123/75 01/18/22 1437  Temp    Pulse 111 01/18/22 1438  Resp 22 01/18/22 1438  SpO2 97 % 01/18/22 1438  Vitals shown include unvalidated device data.  Last Pain:  Vitals:   01/18/22 1313  TempSrc: Oral  PainSc: 0-No pain      Patients Stated Pain Goal: 5 (01/18/22 1313)  Complications: No notable events documented.

## 2022-01-18 NOTE — H&P (Addendum)
Faculty Practice Obstetrics and Gynecology Attending History and Physical  Jennifer Fisher is a 31 y.o. [id number] who presents today for salpingectomy. LMP just ended. She had Nexplanon out 3 weeks ago. She desires no more children and desires permanent sterilization. She does not want a LARC option.    Past Medical History:  Diagnosis Date   Seasonal allergies    Past Surgical History:  Procedure Laterality Date   APPENDECTOMY  2017   in [place]   OB History  Gravida Para Term Preterm AB Living  10 9 8 1  0 4  SAB IAB Ectopic Multiple Live Births  0 0 0   4    # Outcome Date GA Lbr Len/2nd Weight Sex Delivery Anes PTL Lv  10 Gravida           9 Term 12/16/19 [redacted]w[redacted]d / 00:12 3275 g M Vag-Spont EPI  LIV  8 Term 2016     Vag-Spont   LIV  7 Preterm           6 Term           5 Term           4 Term           3 Term      Vag-Spont   LIV  2 Term      Vag-Spont   FD     Birth Comments: IUFD at 32w in 29w, unclear reason  1 Term      Vag-Spont   LIV  Patient denies any other pertinent gynecologic issues.  No current facility-administered medications on file prior to encounter.   Current Outpatient Medications on File Prior to Encounter  Medication Sig Dispense Refill   ibuprofen (ADVIL) 800 MG tablet Take 800 mg by mouth every 8 (eight) hours as needed.     No Known Allergies  Social History:   reports that she has never smoked. She has never used smokeless tobacco. She reports that she does not drink alcohol and does not use drugs. History reviewed. No pertinent family history.  Review of Systems: Pertinent items noted in HPI and remainder of comprehensive ROS otherwise negative.  PHYSICAL EXAM: Height 4\' 11"  (1.499 m), weight 72.6 kg, last menstrual period 01/16/2022, currently breastfeeding. CONSTITUTIONAL: Well-developed, well-nourished female in no acute distress.  HENT:  Normocephalic, atraumatic, External right and left ear normal. Oropharynx is clear and  moist EYES: Conjunctivae and EOM are normal. Pupils are equal, round, and reactive to light. No scleral icterus.  NECK: Normal range of motion, supple, no masses SKIN: Skin is warm and dry. No rash noted. Not diaphoretic. No erythema. No pallor. NEUROLOGIC: Alert and oriented to person, place, and time. Normal reflexes, muscle tone coordination. No cranial nerve deficit noted. PSYCHIATRIC: Normal mood and affect. Normal behavior. Normal judgment and thought content. CARDIOVASCULAR: Normal heart rate noted, regular rhythm RESPIRATORY: Effort and breath sounds normal, no problems with respiration noted ABDOMEN: Soft, nontender, nondistended. PELVIC: Not examined MUSCULOSKELETAL: Normal range of motion. No tenderness.  No cyanosis, clubbing, or edema.  2+ distal pulses.  Labs: Results for orders placed or performed during the hospital encounter of 01/18/22 (from the past 336 hour(s))  Pregnancy, urine POC   Collection Time: 01/18/22  1:02 PM  Result Value Ref Range   Preg Test, Ur NEGATIVE NEGATIVE    Imaging Studies: No results found.  Assessment: Active Problems:   Unwanted fertility   Plan: - She desires permanent sterilization..  - Risks of surgery  include but are not limited to: bleeding, infection, injury to surrounding organs/tissues (i.e. bowel/bladder/ureters), need for additional procedures, wound complications, hospital re-admission, and conversion to open surgery, VTE - Reviewed restrictions and recovery following surgery - Reviewed incision care.  - Consent reviewed and signed.   Interpreter used throughout.   Milas Hock, MD, FACOG Obstetrician & Gynecologist, [facility] for [facility], [facility]

## 2022-01-18 NOTE — Anesthesia Preprocedure Evaluation (Addendum)
Anesthesia Evaluation  Patient identified by MRN, date of birth, ID band Patient awake    Reviewed: Allergy & Precautions, NPO status , Patient's Chart, lab work & pertinent test results  Airway Mallampati: II  TM Distance: >3 FB Neck ROM: Full    Dental  (+) Teeth Intact, Dental Advisory Given   Pulmonary neg pulmonary ROS   breath sounds clear to auscultation       Cardiovascular negative cardio ROS  Rhythm:Regular Rate:Normal     Neuro/Psych  negative psych ROS   GI/Hepatic negative GI ROS, Neg liver ROS,,,  Endo/Other  negative endocrine ROS    Renal/GU negative Renal ROS     Musculoskeletal negative musculoskeletal ROS (+)    Abdominal   Peds  Hematology negative hematology ROS (+)   Anesthesia Other Findings   Reproductive/Obstetrics                             Anesthesia Physical Anesthesia Plan  ASA: 1  Anesthesia Plan: General   Post-op Pain Management: Celebrex PO (pre-op)* and Tylenol PO (pre-op)*   Induction: Intravenous  PONV Risk Score and Plan: 4 or greater and Ondansetron, Dexamethasone, Midazolam and Scopolamine patch - Pre-op  Airway Management Planned: Oral ETT  Additional Equipment: None  Intra-op Plan:   Post-operative Plan: Extubation in OR  Informed Consent: I have reviewed the patients History and Physical, chart, labs and discussed the procedure including the risks, benefits and alternatives for the proposed anesthesia with the patient or authorized representative who has indicated his/her understanding and acceptance.     Interpreter used for [employer] Discussed with: CRNA  Anesthesia Plan Comments:        Anesthesia Quick Evaluation

## 2022-01-18 NOTE — Discharge Instructions (Signed)
     No acetaminophen/Tylenol until after 7:15 pm today if needed.  No ibuprofen, Advil, Aleve, Motrin, ketorolac, meloxicam, naproxen, or other NSAIDS until after 8:15 pm today if needed.     Post Anesthesia Home Care Instructions  Activity: Get plenty of rest for the remainder of the day. A responsible individual must stay with you for 24 hours following the procedure.  For the next 24 hours, DO NOT: -Drive a car -Advertising copywriter -Drink alcoholic beverages -Take any medication unless instructed by your physician -Make any legal decisions or sign important papers.  Meals: Start with liquid foods such as gelatin or soup. Progress to regular foods as tolerated. Avoid greasy, spicy, heavy foods. If nausea and/or vomiting occur, drink only clear liquids until the nausea and/or vomiting subsides. Call your physician if vomiting continues.  Special Instructions/Symptoms: Your throat may feel dry or sore from the anesthesia or the breathing tube placed in your throat during surgery. If this causes discomfort, gargle with warm salt water. The discomfort should disappear within 24 hours.  If you had a scopolamine patch placed behind your ear for the management of post- operative nausea and/or vomiting:  1. The medication in the patch is effective for 72 hours, after which it should be removed.  Wrap patch in a tissue and discard in the trash. Wash hands thoroughly with soap and water. 2. You may remove the patch earlier than 72 hours if you experience unpleasant side effects which may include dry mouth, dizziness or visual disturbances. 3. Avoid touching the patch. Wash your hands with soap and water after contact with the patch.

## 2022-01-19 ENCOUNTER — Encounter (HOSPITAL_BASED_OUTPATIENT_CLINIC_OR_DEPARTMENT_OTHER): Payer: Self-pay | Admitting: Obstetrics and Gynecology

## 2022-01-19 NOTE — Anesthesia Postprocedure Evaluation (Signed)
Anesthesia Post Note  Patient: Jennifer Fisher  Procedure(s) Performed: LAPAROSCOPIC BILATERAL SALPINGECTOMY (Bilateral: Uterus)     Patient location during evaluation: PACU Anesthesia Type: General Level of consciousness: awake and alert Pain management: pain level controlled Vital Signs Assessment: post-procedure vital signs reviewed and stable Respiratory status: spontaneous breathing, nonlabored ventilation, respiratory function stable and patient connected to nasal cannula oxygen Cardiovascular status: blood pressure returned to baseline and stable Postop Assessment: no apparent nausea or vomiting Anesthetic complications: no   No notable events documented.  Last Vitals:  Vitals:   01/18/22 1545 01/18/22 1645  BP: 104/67 103/79  Pulse: 96 (!) 105  Resp: 18 16  Temp:  36.7 C  SpO2: 96% 98%    Last Pain:  Vitals:   01/18/22 1645  TempSrc:   PainSc: 2                  Shelton Silvas

## 2022-01-20 LAB — SURGICAL PATHOLOGY

## 2022-01-23 ENCOUNTER — Encounter: Payer: Self-pay | Admitting: *Deleted

## 2022-02-10 ENCOUNTER — Ambulatory Visit: Payer: Self-pay | Admitting: Obstetrics and Gynecology

## 2022-03-08 NOTE — Progress Notes (Signed)
ANNUAL EXAM Patient name: Jennifer Fisher MRN [id number]  Date of birth: 08/27/1990 Chief Complaint:   Gynecologic Exam  History of Present Illness:   Jennifer Fisher is a 32 y.o. [id number] female being seen today for a routine annual exam.   Current complaints: None  Patient's last menstrual period was 02/26/2022 (exact date).  Current birth control: B/l salpingectomy  Last pap: None on file.   Health Maintenance Due  Topic Date Due   COVID-19 Vaccine (1) Never done   HIV Screening  Never done   DTaP/Tdap/Td (1 - Tdap) Never done   PAP-Cervical Cytology Screening  Never done   PAP SMEAR-Modifier  Never done   INFLUENZA VACCINE  Never done    Review of Systems:   Pertinent items are noted in HPI Denies any headaches, blurred vision, fatigue, shortness of breath, chest pain, abdominal pain, abnormal vaginal discharge/itching/odor/irritation, problems with periods, bowel movements, urination, or intercourse unless otherwise stated above.  Pertinent History Reviewed:  Reviewed past medical,surgical, social and family history.  Reviewed problem list, medications and allergies. Physical Assessment:   Vitals:   03/10/22 1108  BP: 111/77  Pulse: 85  Weight: 139 lb 6.4 oz (63.2 kg)  Height: 4' 10.75" (1.492 m)  Body mass index is 28.4 kg/m.   Physical Examination:  General appearance - well appearing, and in no distress Mental status - alert, oriented to person, place, and time Psych:  She has a normal mood and affect Skin - warm and dry, normal color, no suspicious lesions noted Chest - effort normal Heart - normal rate  Breasts - breasts appear normal, no suspicious masses, no skin or nipple changes or axillary nodes Abdomen - soft, nontender, nondistended, no masses or organomegaly. L/S incisions well healed Pelvic -  VULVA: normal appearing vulva with no masses, tenderness or lesions  VAGINA: normal appearing vagina with normal color and discharge, no lesions   CERVIX: normal appearing cervix without discharge or lesions, no CMT UTERUS: uterus is felt to be normal size, shape, consistency and nontender  ADNEXA: No adnexal masses or tenderness noted. Extremities:  No swelling or varicosities noted  Chaperone present for exam  No results found for this or any previous visit (from the past 24 hour(s)).  Assessment & Plan:  Amil was seen today for gynecologic exam.  Diagnoses and all orders for this visit:  Language barrier In person interpreter used throughout  Encounter for annual routine gynecological examination - Cervical cancer screening: Discussed screening Q3 years. Reviewed importance of annual exams and limits of pap smear. Pap with reflex HPV done - GC/CT: Discussed and recommended. Pt  accepts along with blood work - Birth Control: Salpingectomy - Breast Health: Encouraged self breast awareness/exams. Teaching provided. - Follow-up: 12 months and prn -     Cytology - PAP( [facility])  Routine screening for STI (sexually transmitted infection) -     Hepatitis C Antibody -     Hepatitis B Surface AntiGEN -     HIV antibody (with reflex) -     RPR -     Cervicovaginal ancillary only( [place])       Orders Placed This Encounter  Procedures   Hepatitis C Antibody   Hepatitis B Surface AntiGEN   HIV antibody (with reflex)   RPR    Meds: No orders of the defined types were placed in this encounter.   Follow-up: Return in about 1 year (around 03/11/2023) for annual.  Radene Gunning, MD 03/10/2022 11:38 AM

## 2022-03-10 ENCOUNTER — Other Ambulatory Visit (HOSPITAL_COMMUNITY)
Admission: RE | Admit: 2022-03-10 | Discharge: 2022-03-10 | Disposition: A | Discharge status: Home / Self Care | Payer: Medicaid Other | Source: Ambulatory Visit | Attending: Obstetrics and Gynecology | Admitting: Obstetrics and Gynecology

## 2022-03-10 ENCOUNTER — Other Ambulatory Visit: Payer: Self-pay

## 2022-03-10 ENCOUNTER — Encounter: Payer: Self-pay | Admitting: Obstetrics and Gynecology

## 2022-03-10 ENCOUNTER — Ambulatory Visit (INDEPENDENT_AMBULATORY_CARE_PROVIDER_SITE_OTHER): Payer: Medicaid Other | Admitting: Obstetrics and Gynecology

## 2022-03-10 VITALS — BP 111/77 | HR 85 | Ht 58.75 in | Wt 139.4 lb

## 2022-03-10 DIAGNOSIS — Z789 Other specified health status: Secondary | ICD-10-CM | POA: Diagnosis not present

## 2022-03-10 DIAGNOSIS — Z113 Encounter for screening for infections with a predominantly sexual mode of transmission: Secondary | ICD-10-CM | POA: Diagnosis present

## 2022-03-10 DIAGNOSIS — Z01419 Encounter for gynecological examination (general) (routine) without abnormal findings: Secondary | ICD-10-CM

## 2022-03-11 LAB — HEPATITIS C ANTIBODY: Hep C Virus Ab: NONREACTIVE

## 2022-03-11 LAB — HIV ANTIBODY (ROUTINE TESTING W REFLEX): HIV Screen 4th Generation wRfx: NONREACTIVE

## 2022-03-11 LAB — RPR: RPR Ser Ql: NONREACTIVE

## 2022-03-11 LAB — HEPATITIS B SURFACE ANTIGEN: Hepatitis B Surface Ag: NEGATIVE

## 2022-03-13 LAB — CERVICOVAGINAL ANCILLARY ONLY
Chlamydia: NEGATIVE
Comment: NEGATIVE
Comment: NEGATIVE
Comment: NORMAL
Neisseria Gonorrhea: NEGATIVE
Trichomonas: NEGATIVE

## 2022-03-14 LAB — CYTOLOGY - PAP
Adequacy: ABSENT
Diagnosis: NEGATIVE

## 2022-04-13 IMAGING — US US MFM OB DETAIL+14 WK
1 series · 14 of 28 positions shown · non-contrast
Comparison: none

[Series 1: us mfm ob detail+14 wk · 43 acquisitions, 14 frames shown]
[im 2/43]
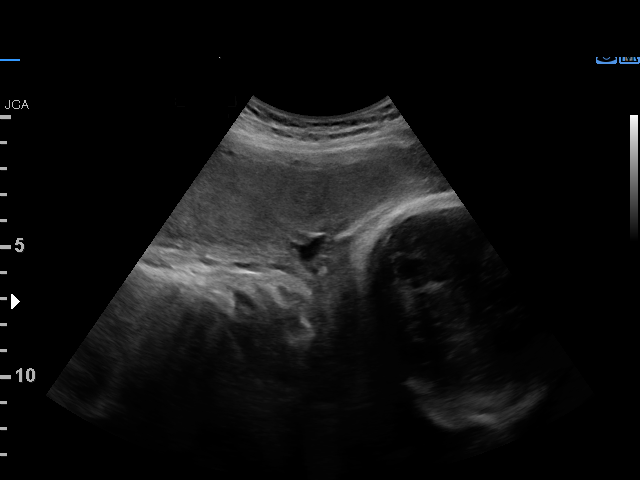
[im 5/43]
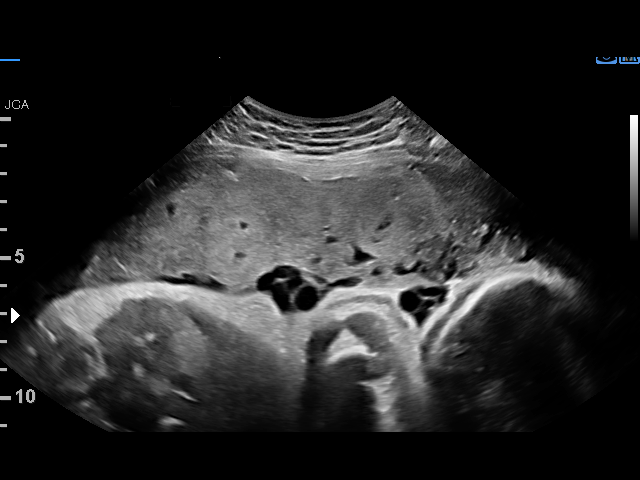
[im 8/43]
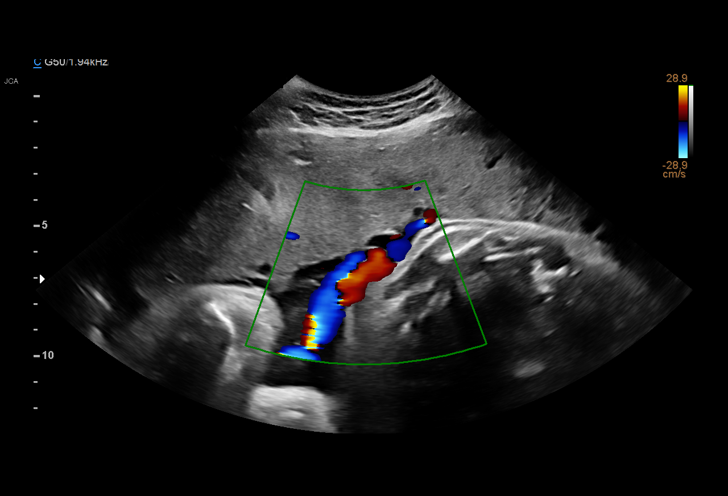
[im 11/43]
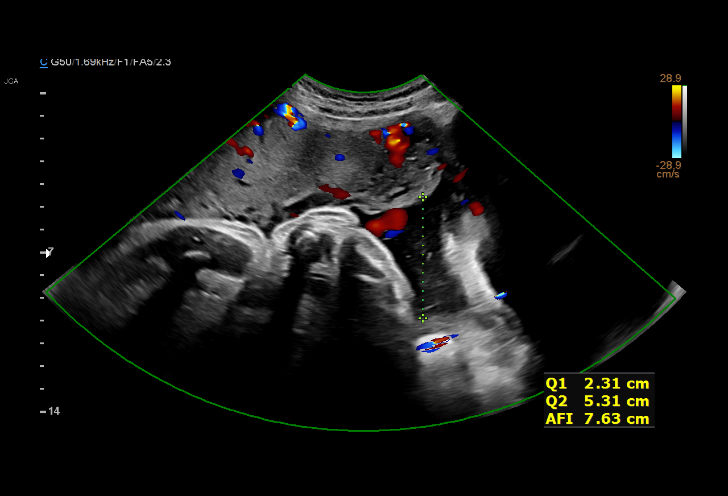
[im 15/43]
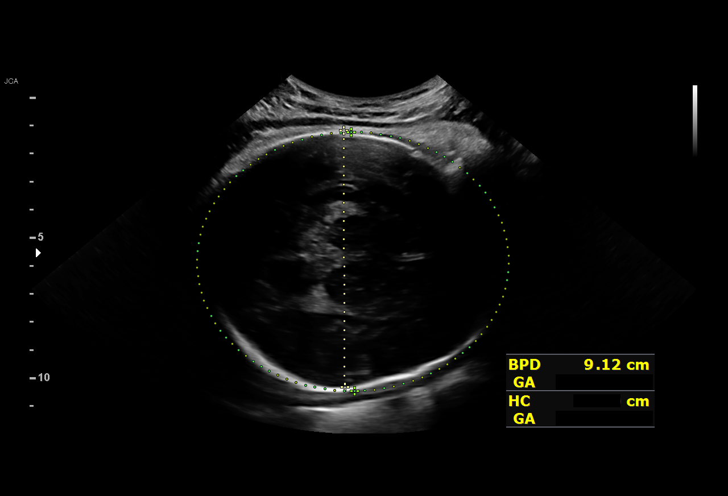
[im 18/43]
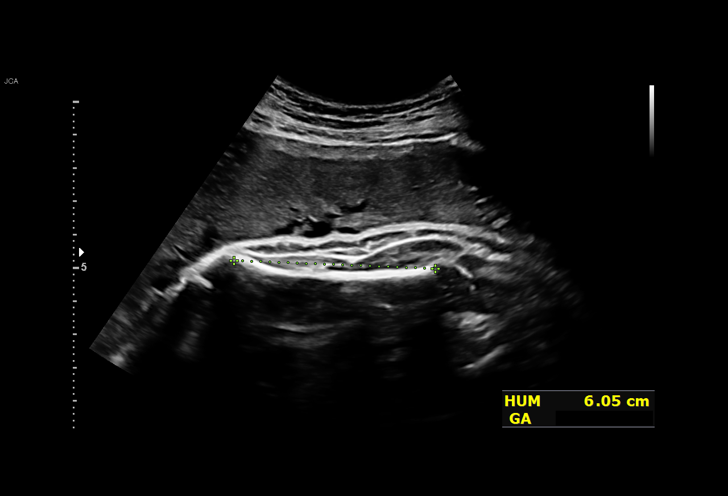
[im 21/43]
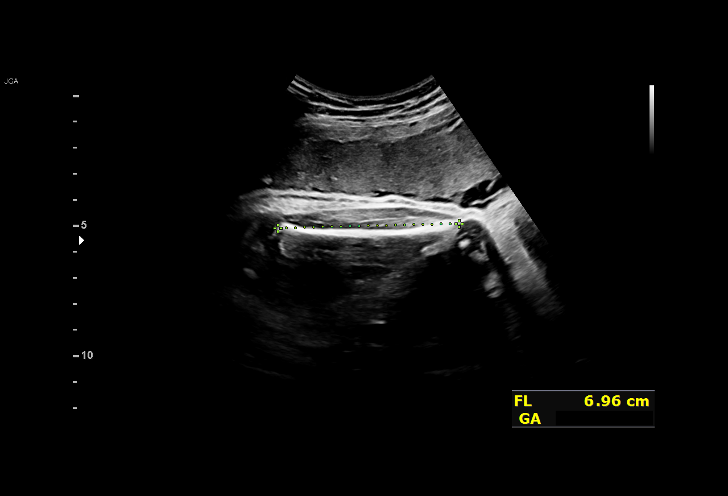
[im 24/43]
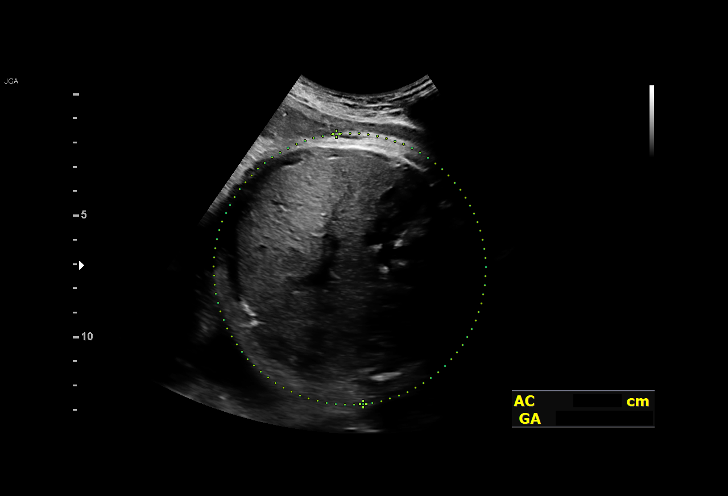
[im 27/43]
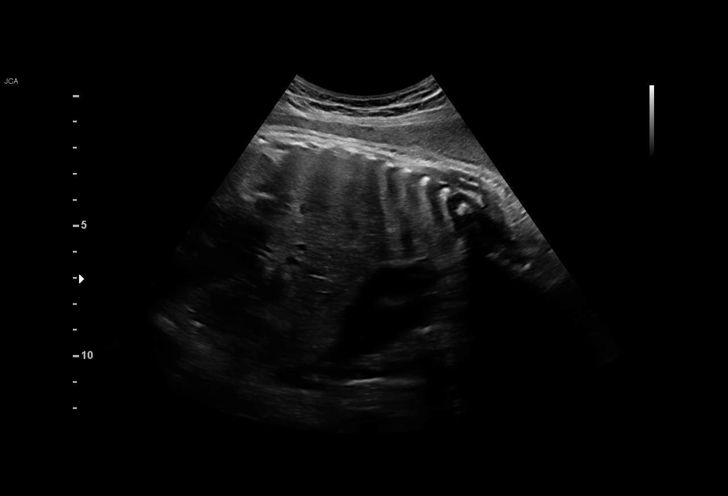
[im 30/43]
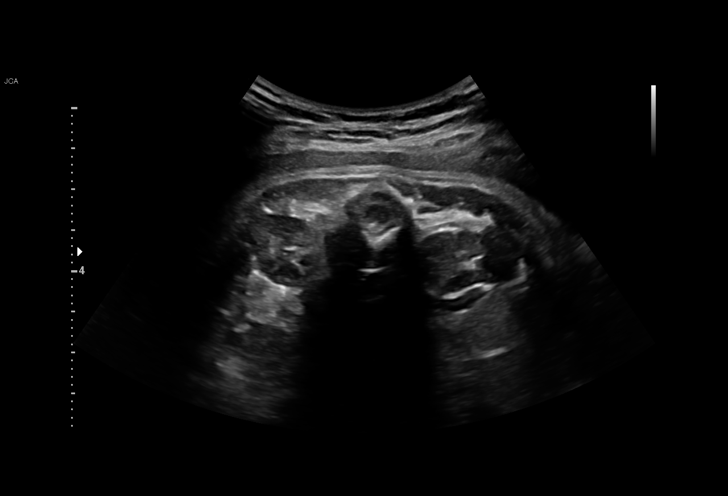
[im 33/43]
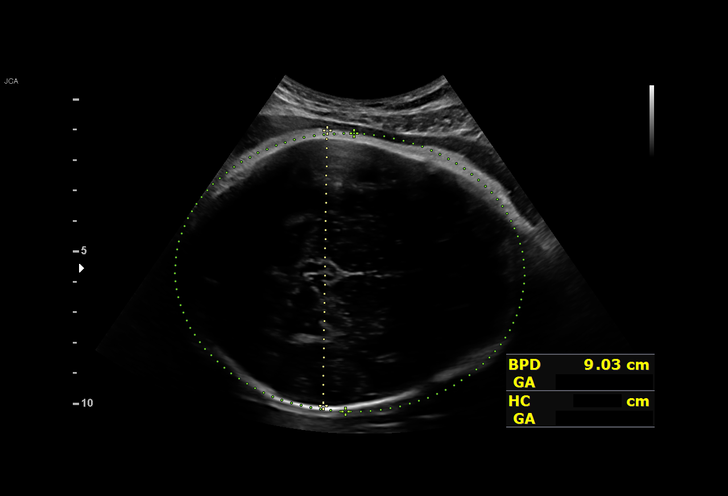
[im 36/43]
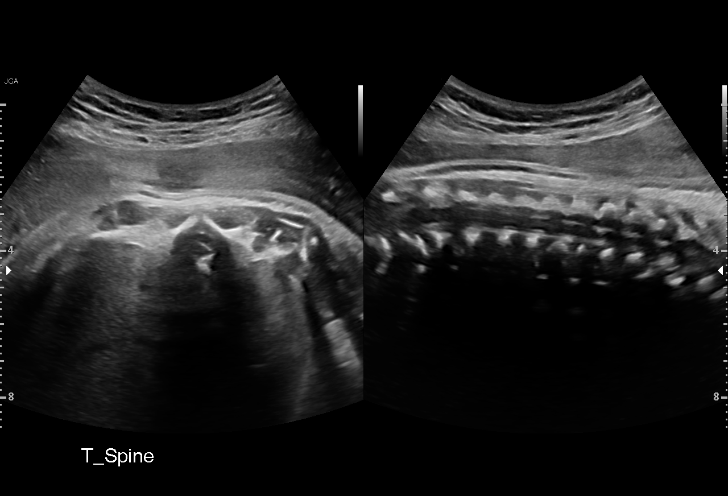
[im 39/43]
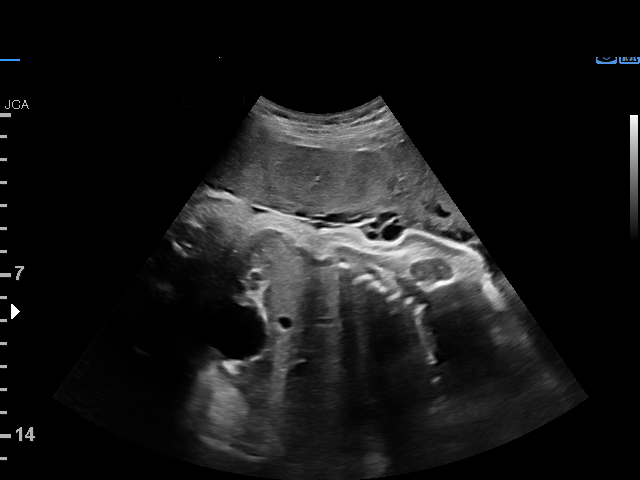
[im 43/43]
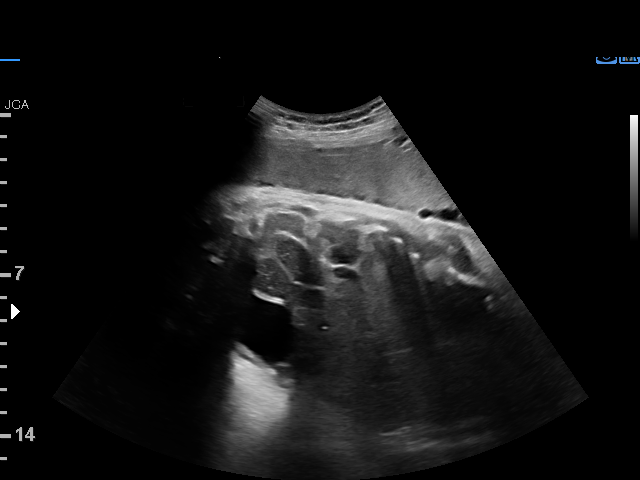

[14 of 28 positions shown; findings below may reference images not displayed]

Indications

 Encounter for uncertain dates
 36 weeks gestation of pregnancy
Fetal Evaluation

 Num Of Fetuses:         1
 Fetal Heart Rate(bpm):  133
 Cardiac Activity:       Observed
 Presentation:           Cephalic
 Placenta:               Anterior
 P. Cord Insertion:      Visualized

 Amniotic Fluid
 AFI FV:      Within normal limits

 AFI Sum(cm)     %Tile       Largest Pocket(cm)
 14.62           54

 RUQ(cm)       RLQ(cm)       LUQ(cm)        LLQ(cm)

Biophysical Evaluation

 Amniotic F.V:   Within normal limits       F. Tone:        Observed
 F. Movement:    Observed                   Score:          [DATE]
 F. Breathing:   Not Observed
Biometry

 BPD:      90.2  mm     G. Age:  36w 4d         65  %    CI:        78.72   %    70 - 86
                                                         FL/HC:      21.3   %    20.1 -
 HC:      321.5  mm     G. Age:  36w 2d         19  %    HC/AC:      0.95        0.93 -
 AC:       338   mm     G. Age:  37w 5d         90  %    FL/BPD:     75.9   %    71 - 87
 FL:       68.5  mm     G. Age:  35w 1d         17  %    FL/AC:      20.3   %    20 - 24
 HUM:      61.8  mm     G. Age:  35w 5d         54  %

 Est. FW:    8186  gm    6 lb 11 oz      64  %
Gestational Age

 U/S Today:     36w 3d                                        EDD:   01/05/20
 Best:          36w 3d     Det. By:  U/S (12/11/19)           EDD:   01/05/20
Anatomy

 Cranium:               Appears normal         LVOT:                   Not well visualized
 Cavum:                 Not well visualized    Aortic Arch:            Not well visualized
 Ventricles:            Not well visualized    Ductal Arch:            Not well visualized
 Choroid Plexus:        Not well visualized    Diaphragm:              Appears normal
 Cerebellum:            Not well visualized    Stomach:                Appears normal, left
                                                                       sided
 Posterior Fossa:       Not well visualized    Abdomen:                Not well visualized
 Nuchal Fold:           Not applicable (>20    Abdominal Wall:         Not well visualized
                        wks GA)
 Face:                  Not well visualized    Cord Vessels:           Not well visualized
 Lips:                  Not well visualized    Kidneys:                Appear normal
 Palate:                Not well visualized    Bladder:                Appears normal
 Thoracic:              Appears normal         Spine:                  Appears normal
 Heart:                 Not well visualized    Upper Extremities:      Not well visualized
 RVOT:                  Not well visualized    Lower Extremities:      Not well visualized

 Other:  Technically difficult due to advanced GA and fetal position.
Comments

 This patient was seen for an ultrasound to estimate her due
 date as she is a refugee from [place] and has had
 limited prenatal care in her current pregnancy.

 The fetal biometry measurements obtained today are
 consistent with a pregnancy at 36 weeks and 3 days, giving
 her an EDC January 05, 2020.  There was normal
 amniotic fluid noted today.

 A biophysical profile performed today was [DATE].  She
 received a -2 for fetal breathing movements that did not meet
 criteria.  The patient had a reactive nonstress test in the [facility].

## 2022-04-18 ENCOUNTER — Other Ambulatory Visit: Payer: Self-pay | Admitting: Obstetrics and Gynecology

## 2022-04-27 ENCOUNTER — Telehealth: Payer: Self-pay | Admitting: Emergency Medicine

## 2022-04-27 NOTE — Telephone Encounter (Signed)
Attempted TC to patient with Interpreter. LVM Received request for SLYND refill from pharmacy.  Need confirmation from pt that she still wants Rx. Pt S/P Bilat Salpingectomy

## 2023-01-20 ENCOUNTER — Encounter (HOSPITAL_COMMUNITY): Payer: Self-pay | Admitting: *Deleted

## 2023-01-20 ENCOUNTER — Emergency Department (HOSPITAL_COMMUNITY)
Admission: EM | Admit: 2023-01-20 | Discharge: 2023-01-21 | Disposition: A | Discharge status: Home / Self Care | Payer: Medicaid Other | Attending: Emergency Medicine | Admitting: Emergency Medicine

## 2023-01-20 ENCOUNTER — Other Ambulatory Visit: Payer: Self-pay

## 2023-01-20 DIAGNOSIS — J181 Lobar pneumonia, unspecified organism: Secondary | ICD-10-CM | POA: Diagnosis not present

## 2023-01-20 DIAGNOSIS — R1030 Lower abdominal pain, unspecified: Secondary | ICD-10-CM | POA: Diagnosis not present

## 2023-01-20 DIAGNOSIS — J020 Streptococcal pharyngitis: Secondary | ICD-10-CM | POA: Diagnosis not present

## 2023-01-20 DIAGNOSIS — Z20822 Contact with and (suspected) exposure to covid-19: Secondary | ICD-10-CM | POA: Diagnosis not present

## 2023-01-20 DIAGNOSIS — J189 Pneumonia, unspecified organism: Secondary | ICD-10-CM

## 2023-01-20 DIAGNOSIS — R059 Cough, unspecified: Secondary | ICD-10-CM | POA: Diagnosis present

## 2023-01-20 LAB — URINALYSIS, ROUTINE W REFLEX MICROSCOPIC
Bilirubin Urine: NEGATIVE
Glucose, UA: NEGATIVE mg/dL
Ketones, ur: NEGATIVE mg/dL
Leukocytes,Ua: NEGATIVE
Nitrite: NEGATIVE
Protein, ur: NEGATIVE mg/dL
RBC / HPF: >50 RBC/hpf (ref 0–5)
Specific Gravity, Urine: 1.021 (ref 1.005–1.030)
pH: 5 (ref 5.0–8.0)

## 2023-01-20 LAB — COMPREHENSIVE METABOLIC PANEL
ALT: 27 U/L (ref 0–44)
AST: 23 U/L (ref 15–41)
Albumin: 3.6 g/dL (ref 3.5–5.0)
Alkaline Phosphatase: 59 U/L (ref 38–126)
Anion gap: 10 (ref 5–15)
BUN: 9 mg/dL (ref 6–20)
CO2: 21 mmol/L — ABNORMAL LOW (ref 22–32)
Calcium: 9 mg/dL (ref 8.9–10.3)
Chloride: 105 mmol/L (ref 98–111)
Creatinine, Ser: 0.63 mg/dL (ref 0.44–1.00)
GFR, Estimated: >60 mL/min (ref >60)
Glucose, Bld: 87 mg/dL (ref 70–99)
Potassium: 3.5 mmol/L (ref 3.5–5.1)
Sodium: 136 mmol/L (ref 135–145)
Total Bilirubin: 0.4 mg/dL (ref <1.2)
Total Protein: 7.2 g/dL (ref 6.5–8.1)

## 2023-01-20 LAB — LIPASE, BLOOD: Lipase: 29 U/L (ref 11–51)

## 2023-01-20 LAB — CBC
HCT: 33.6 % — ABNORMAL LOW (ref 36.0–46.0)
Hemoglobin: 10.6 g/dL — ABNORMAL LOW (ref 12.0–15.0)
MCH: 25.1 pg — ABNORMAL LOW (ref 26.0–34.0)
MCHC: 31.5 g/dL (ref 30.0–36.0)
MCV: 79.4 fL — ABNORMAL LOW (ref 80.0–100.0)
Platelets: 223 10*3/uL (ref 150–400)
RBC: 4.23 MIL/uL (ref 3.87–5.11)
RDW: 14.6 % (ref 11.5–15.5)
WBC: 4.6 10*3/uL (ref 4.0–10.5)
nRBC: 0 % (ref 0.0–0.2)

## 2023-01-20 LAB — HCG, SERUM, QUALITATIVE: Preg, Serum: NEGATIVE

## 2023-01-20 NOTE — ED Triage Notes (Signed)
The pt has had a cough and abd pain for one week

## 2023-01-21 ENCOUNTER — Emergency Department (HOSPITAL_COMMUNITY): Payer: Medicaid Other

## 2023-01-21 LAB — RESP PANEL BY RT-PCR (RSV, FLU A&B, COVID)  RVPGX2
Influenza A by PCR: NEGATIVE
Influenza B by PCR: NEGATIVE
Resp Syncytial Virus by PCR: NEGATIVE
SARS Coronavirus 2 by RT PCR: NEGATIVE

## 2023-01-21 LAB — GROUP A STREP BY PCR: Group A Strep by PCR: DETECTED — AB

## 2023-01-21 MED ORDER — LIDOCAINE VISCOUS HCL 2 % MT SOLN
15.0000 mL | Freq: Once | OROMUCOSAL | Status: AC
  Administered 2023-01-21: 15 mL via OROMUCOSAL
  Filled 2023-01-21: qty 15

## 2023-01-21 MED ORDER — DOXYCYCLINE HYCLATE 100 MG PO TABS
100.0000 mg | ORAL_TABLET | Freq: Once | ORAL | Status: AC
  Administered 2023-01-21: 100 mg via ORAL
  Filled 2023-01-21: qty 1

## 2023-01-21 MED ORDER — BENZONATATE 100 MG PO CAPS: 100.0000 mg | ORAL_CAPSULE | Freq: Three times a day (TID) | ORAL | 0 refills | Status: DC

## 2023-01-21 MED ORDER — DOXYCYCLINE HYCLATE 100 MG PO CAPS: 100.0000 mg | ORAL_CAPSULE | Freq: Two times a day (BID) | ORAL | 0 refills | Status: AC

## 2023-01-21 MED ORDER — PENICILLIN G BENZATHINE 1200000 UNIT/2ML IM SUSY
1.2000 10*6.[IU] | PREFILLED_SYRINGE | Freq: Once | INTRAMUSCULAR | Status: AC
  Administered 2023-01-21: 1.2 10*6.[IU] via INTRAMUSCULAR
  Filled 2023-01-21: qty 2

## 2023-01-21 MED ORDER — IOHEXOL 350 MG/ML SOLN
75.0000 mL | Freq: Once | INTRAVENOUS | Status: AC | PRN
  Administered 2023-01-21: 75 mL via INTRAVENOUS

## 2023-01-21 MED ORDER — BENZONATATE 100 MG PO CAPS
100.0000 mg | ORAL_CAPSULE | Freq: Once | ORAL | Status: AC
  Administered 2023-01-21: 100 mg via ORAL
  Filled 2023-01-21: qty 1

## 2023-01-21 MED ORDER — ACETAMINOPHEN ER 650 MG PO TBCR: 650.0000 mg | EXTENDED_RELEASE_TABLET | Freq: Three times a day (TID) | ORAL | 0 refills | Status: AC | PRN

## 2023-01-21 NOTE — ED Notes (Signed)
Pt returned from CT °

## 2023-01-21 NOTE — ED Notes (Signed)
Pt temperature elevated, RN notified

## 2023-01-21 NOTE — Discharge Instructions (Addendum)
It was a pleasure taking care of you today.  As discussed, your strep test was positive.  You were treated for strep throat here in the ER.  Your CT scan also showed evidence of pneumonia.  I am sending you home with an antibiotic.  Take antibiotics as prescribed and finish all antibiotics.  Your CT scan showed a small area on your liver.  Please follow-up with PCP for further evaluation.  Return to the ER for new or worsening symptoms.

## 2023-01-21 NOTE — ED Notes (Signed)
PO challenged pt, pt tolerated well

## 2023-01-21 NOTE — ED Provider Notes (Signed)
[facility] EMERGENCY DEPARTMENT AT [facility] Provider Note   CSN: [id number] Arrival date & time: 01/20/23  1755     History  Chief Complaint  Patient presents with   Generalized Body Aches    Jennifer Fisher is a 32 y.o. female with a past medical history significant for seasonal allergies who presents to the ED due to cough, headache and pain on the inside of her L cheek for 1 week. Also endorses some lower abdominal pain. She reports the cough is sometimes productive and other times it is not, she is unsure of the color of the sputum. Denies any hemoptysis, SOB, chest pain. She describes the headache as throbbing and localized to the posterior head. She also reports mild abdominal pain that occurs intermittently, but denies any hematochezia, diarrhea, constipation. Denies vaginal and urinary symptoms. She has tried Ibuprofen at home with minimal relief. She was recently visiting at a friends house that was experiencing some URI symptoms, but she's unsure what the person was sick with. Admits to dysphagia, headache, sore throat, fatigue, headache, appetite loss.   History obtained from patient and past medical records. Declined official interpreter, husband interpreted during entire encounter      Home Medications Prior to Admission medications   Medication Sig Start Date End Date Taking? Authorizing Provider  acetaminophen (TYLENOL 8 HOUR) 650 MG CR tablet Take 1 tablet (650 mg total) by mouth every 8 (eight) hours as needed for pain. 01/21/23  Yes Jameya Pontiff C, PA-C  benzonatate (TESSALON) 100 MG capsule Take 1 capsule (100 mg total) by mouth every 8 (eight) hours. 01/21/23  Yes Cady Hafen, Merla Riches, PA-C  doxycycline (VIBRAMYCIN) 100 MG capsule Take 1 capsule (100 mg total) by mouth 2 (two) times daily for 5 days. 01/21/23 01/26/23 Yes Elianny Buxbaum, Merla Riches, PA-C  ibuprofen (ADVIL) 800 MG tablet Take 800 mg by mouth every 8 (eight) hours as needed.    [provider]  oxyCODONE (ROXICODONE) 5 MG immediate release tablet Take 1 tablet (5 mg total) by mouth every 4 (four) hours as needed for severe pain. Patient not taking: Reported on 03/10/2022 01/18/22   Lennart Pall, MD      Allergies    Patient has no known allergies.    Review of Systems   Review of Systems  Constitutional:  Negative for fever.  HENT:  Positive for sore throat.   Respiratory:  Positive for cough. Negative for shortness of breath.   Cardiovascular:  Negative for chest pain.  Gastrointestinal:  Positive for abdominal pain. Negative for diarrhea, nausea and vomiting.    Physical Exam Updated Vital Signs BP 100/71   Pulse 84   Temp 98.1 F (36.7 C) (Oral)   Resp 16   Ht 4\' 10"  (1.473 m)   Wt 63.2 kg   LMP 01/20/2023 Comment: bilateral salpingectomy  SpO2 97%   BMI 29.12 kg/m  Physical Exam Vitals and nursing note reviewed.  Constitutional:      General: She is not in acute distress.    Appearance: She is not ill-appearing.  HENT:     Head: Normocephalic.  Eyes:     Pupils: Pupils are equal, round, and reactive to light.  Cardiovascular:     Rate and Rhythm: Normal rate and regular rhythm.     Pulses: Normal pulses.     Heart sounds: Normal heart sounds. No murmur heard.    No friction rub. No gallop.  Pulmonary:     Effort: Pulmonary effort  is normal.     Breath sounds: Normal breath sounds.     Comments: Coughing on exam. Lungs clear to auscultation bilaterally Abdominal:     General: Abdomen is flat. There is no distension.     Palpations: Abdomen is soft.     Tenderness: There is abdominal tenderness. There is no guarding or rebound.     Comments: Lower abdominal tenderness  Musculoskeletal:        General: Normal range of motion.     Cervical back: Neck supple.  Skin:    General: Skin is warm and dry.  Neurological:     General: No focal deficit present.     Mental Status: She is alert.  Psychiatric:        Mood and Affect:  Mood normal.        Behavior: Behavior normal.     ED Results / Procedures / Treatments   Labs (all labs ordered are listed, but only abnormal results are displayed) Labs Reviewed  GROUP A STREP BY PCR - Abnormal; Notable for the following components:      Result Value   Group A Strep by PCR DETECTED (*)    All other components within normal limits  COMPREHENSIVE METABOLIC PANEL - Abnormal; Notable for the following components:   CO2 21 (*)    All other components within normal limits  CBC - Abnormal; Notable for the following components:   Hemoglobin 10.6 (*)    HCT 33.6 (*)    MCV 79.4 (*)    MCH 25.1 (*)    All other components within normal limits  URINALYSIS, ROUTINE W REFLEX MICROSCOPIC - Abnormal; Notable for the following components:   Hgb urine dipstick MODERATE (*)    Bacteria, UA RARE (*)    All other components within normal limits  RESP PANEL BY RT-PCR (RSV, FLU A&B, COVID)  RVPGX2  LIPASE, BLOOD  HCG, SERUM, QUALITATIVE    EKG None  Radiology CT ABDOMEN PELVIS W CONTRAST  Result Date: 01/21/2023 CLINICAL DATA:  Cough and abdominal pain for 1 week. EXAM: CT ABDOMEN AND PELVIS WITH CONTRAST TECHNIQUE: Multidetector CT imaging of the abdomen and pelvis was performed using the standard protocol following bolus administration of intravenous contrast. RADIATION DOSE REDUCTION: This exam was performed according to the departmental dose-optimization program which includes automated exposure control, adjustment of the mA and/or kV according to patient size and/or use of iterative reconstruction technique. CONTRAST:  75mL OMNIPAQUE IOHEXOL 350 MG/ML SOLN COMPARISON:  Pelvic ultrasound dated 12/11/2019. FINDINGS: Lower chest: There are moderate ground-glass opacities in the right lower lobe. Hepatobiliary: A hypoattenuating area in the left hepatic lobe measures 2.6 cm (series 3, image 23). No gallstones, gallbladder wall thickening, or biliary dilatation. Pancreas:  Unremarkable. No pancreatic ductal dilatation or surrounding inflammatory changes. Spleen: Normal in size without focal abnormality. Adrenals/Urinary Tract: Adrenal glands are unremarkable. 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 No renal calculi, focal lesion, or hydronephrosis. Bladder is unremarkable. Stomach/Bowel: Stomach is within normal limits. The appendix is not definitely identified, however no pericecal inflammatory changes are noted to suggest acute appendicitis. No evidence of bowel wall thickening, distention, or inflammatory changes. Vascular/Lymphatic: No significant vascular findings are present. No enlarged abdominal or pelvic lymph nodes. Reproductive: Uterus and bilateral adnexa are unremarkable. Other: No abdominal wall hernia or abnormality. No abdominopelvic ascites. Musculoskeletal: There are 6 lumbar type vertebral bodies and bilateral pars defects at L5. IMPRESSION:  1. Moderate ground-glass opacities in the right lower lobe are concerning for pneumonia. 2. Hypoattenuating area in the left hepatic lobe is incompletely characterized on this exam, but this likely represents focal fat given its appearance and characteristic location. A nonemergent MRI of the abdomen could be performed if clinically indicated (such as known liver disease or a known malignancy). 3. Cross fused renal ectopia. Electronically Signed   By: Romona Curls M.D.   On: 01/21/2023 09:53   DG Chest Portable 1 View  Result Date: 01/21/2023 CLINICAL DATA:  Cough. EXAM: PORTABLE CHEST 1 VIEW COMPARISON:  No comparison studies available. FINDINGS: The heart size and mediastinal contours are within normal limits. Both lungs are clear. The visualized skeletal structures are unremarkable. IMPRESSION: No acute findings. Electronically Signed   By: Kennith Center M.D.   On: 01/21/2023 07:51    Procedures Procedures    Medications Ordered in ED Medications   benzonatate (TESSALON) capsule 100 mg (100 mg Oral Given 01/21/23 0730)  lidocaine (XYLOCAINE) 2 % viscous mouth solution 15 mL (15 mLs Mouth/Throat Given 01/21/23 0731)  iohexol (OMNIPAQUE) 350 MG/ML injection 75 mL (75 mLs Intravenous Contrast Given 01/21/23 0859)  penicillin g benzathine (BICILLIN LA) 1200000 UNIT/2ML injection 1.2 Million Units (1.2 Million Units Intramuscular Given 01/21/23 0939)  doxycycline (VIBRA-TABS) tablet 100 mg (100 mg Oral Given 01/21/23 1028)    ED Course/ Medical Decision Making/ A&P Clinical Course as of 01/21/23 1039  Sun Jan 21, 2023  0921 Patient no longer breastfeeding. Bicillin given for strep throat [CA]    Clinical Course User Index [CA] Mannie Stabile, PA-C                                 Medical Decision Making Amount and/or Complexity of Data Reviewed Labs: ordered. Decision-making details documented in ED Course. Radiology: ordered and independent interpretation performed. Decision-making details documented in ED Course.  Risk Prescription drug management.   This patient presents to the ED for concern of cough, abdominal pain, sore throat, this involves an extensive number of treatment options, and is a complaint that carries with it a high risk of complications and morbidity.  The differential diagnosis includes viral process, PNA, UTI, etc  32 year old female presents to the ED due to cough, headache, abdominal pain x 1 week.  Was recently around someone similar symptoms.  Upon arrival, vitals all within normal limits however, during patient's 13-hour wait she developed a fever of 101.1 F.  Patient nontoxic-appearing.  On exam patient has lower abdominal tenderness.  No meningismus to suggest meningitis.  Coughing on exam.  Lungs clear to auscultation bilaterally. Does have a sore on the inner left cheek, no abscess. No signs of infection. No other sores. Routine labs ordered in triage. Added RVP, strep, and CXR. Also added CT abdomen.  Tessalon Perles and viscous lidocaine given.  CBC with no leukocytosis.  Hemoglobin at 10.6.  Lipase normal.  Low suspicion for pancreatitis.  CMP with normal renal function.  Pregnancy test negative.  UA negative for signs of infection.  RVP negative.  Strep positive.  Patient no longer breast-feeding, Bicillin given here in the ED.  Chest x-ray personally reviewed and interpreted which is negative for signs of pneumonia, pneumothorax, or widened mediastinum.  CT abdomen demonstrates right lower lobe opacity likely pneumonia.  Given persistent cough we will treat for pneumonia with doxycycline.  Also demonstrates a hypoattenuating area in the left hepatic  lobe.  Patient made aware of incidental findings and need to follow-up.  Patient able to tolerate p.o. at bedside.  Do not feel patient needs admission for pneumonia at this time.  Patient stable for discharge. Strict ED precautions discussed with patient. Patient states understanding and agrees to plan. Patient discharged home in no acute distress and stable vitals  Co morbidities that complicate the patient evaluation  allergies  Social Determinants of Health:  Language barrier- husband interpreted during entire encounter  Test / Admission - Considered:  Considered admission for PNA: however no evidence of sepsis. Feel patient is stable for discharge home with po antibiotics. .        Final Clinical Impression(s) / ED Diagnoses Final diagnoses:  Strep throat  Pneumonia of right lower lobe due to infectious organism    Rx / DC Orders ED Discharge Orders          Ordered    doxycycline (VIBRAMYCIN) 100 MG capsule  2 times daily        01/21/23 1036    acetaminophen (TYLENOL 8 HOUR) 650 MG CR tablet  Every 8 hours PRN        01/21/23 1036    benzonatate (TESSALON) 100 MG capsule  Every 8 hours        01/21/23 1036              Jesusita Oka 01/21/23 1039    Derwood Kaplan, MD 01/25/23 2043

## 2023-08-21 NOTE — Progress Notes (Signed)
 Office Visit Note  Patient: Jennifer Fisher             Date of Birth: 18-Nov-1990           MRN: [id number]             PCP: [facility] 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 female seen for the evaluation of joint pain and muscle pain.  Patient states about 4 years ago while she was living [place] she started having some left shoulder pain that she describes in the scapular region.  She states she moved to [place] in in 2021.  She states she delivered her last child in November 2021 after that she started having increased pain.  She describes discomfort in her neck, left shoulder, wrist, hands, knees, her feet.  She has not noticed any joint swelling.  She also gives history of muscle pain in all of her muscles.  There is no history of rash.  She gives history of fatigue.  She has nocturnal pain which wakes her up otherwise she sleeps well through the night.  She denies any history of rash, psoriasis.  There is no family history of psoriasis.  There is no family history of autoimmune disease. 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  She has 4 living children.  She is gravida 5, Jennifer 4, 1 stillbirth, no history of preeclampsia or DVTs.  Is status post tubal ligation.  She does not drink any alcohol and does not smoke.  She has never been a smoker.    Activities of Daily Living:  Patient reports morning stiffness for 10-15 minutes.   Patient Reports nocturnal pain.  Difficulty dressing/grooming: Denies Difficulty climbing stairs: Reports Difficulty getting out of chair: Denies Difficulty using hands for taps, buttons, cutlery, and/or writing: Denies  Review of Systems  Constitutional:  Positive for fatigue.  HENT:  Negative for mouth sores and mouth dryness.    Eyes:  Negative for dryness.  Respiratory:  Negative for shortness of breath.   Cardiovascular:  Positive for palpitations. Negative for chest pain.  Gastrointestinal:  Negative for blood in stool, constipation and diarrhea.  Endocrine: Negative for increased urination.  Genitourinary:  Negative for involuntary urination.  Musculoskeletal:  Positive for joint pain, joint pain, myalgias, morning stiffness and myalgias. Negative for gait problem, joint swelling, muscle weakness and muscle tenderness.  Skin:  Positive for hair loss. Negative for color change, rash and sensitivity to sunlight.  Allergic/Immunologic: Negative for susceptible to infections.  Neurological:  Positive for dizziness and headaches.  Hematological:  Negative for swollen glands.  Psychiatric/Behavioral:  Positive for sleep disturbance. Negative for depressed mood. The patient is nervous/anxious.     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    Past Medical History:  Diagnosis Date   Seasonal allergies     Family History  Problem Relation Age of Onset   Healthy Mother    Healthy Father    Healthy Sister    Healthy Sister    Healthy Sister    Healthy Brother    Healthy Brother    Past Surgical History:  Procedure Laterality Date   APPENDECTOMY  2017   in [place]  LAPAROSCOPIC BILATERAL SALPINGECTOMY Bilateral 01/18/2022   Procedure: LAPAROSCOPIC BILATERAL SALPINGECTOMY;  Surgeon: Jennifer Moccasin, MD;  Location: [facility];  Service: Gynecology;  Laterality: Bilateral;   Social History   Social History Narrative   ** Merged History Encounter **       There is no immunization history for the selected administration types on file for this patient.   Objective: Vital Signs: BP 104/70 (BP Location: Right Arm, Patient Position: Sitting, Cuff Size: Normal)    Pulse 88   Resp 16   Ht 4' 11 (1.499 m)   Wt 139 lb 3.2 oz (63.1 kg)   LMP 08/14/2023   Breastfeeding No   BMI 28.11 kg/m    Physical Exam Vitals and nursing note reviewed.  Constitutional:      Appearance: She is well-developed.  HENT:     Head: Normocephalic and atraumatic.  Eyes:     Conjunctiva/sclera: Conjunctivae normal.  Cardiovascular:     Rate and Rhythm: Normal rate and regular rhythm.     Heart sounds: Normal heart sounds.  Pulmonary:     Effort: Pulmonary effort is normal.     Breath sounds: Normal breath sounds.  Abdominal:     General: Bowel sounds are normal.     Palpations: Abdomen is soft.  Musculoskeletal:     Cervical back: Normal range of motion.  Lymphadenopathy:     Cervical: No cervical adenopathy.  Skin:    General: Skin is warm and dry.     Capillary Refill: Capillary refill takes less than 2 seconds.  Neurological:     Mental Status: She is alert and oriented to person, place, and time.  Psychiatric:        Behavior: Behavior normal.      Musculoskeletal Exam: She has stiffness with range of motion of the cervical spine.  She had tenderness over the left subscapular region.  There was no tenderness over the thoracic or lumbar spine.  Shoulders, elbows, wrist joints, MCPs PIPs and DIPs with good range of motion with no synovitis.  She had no tenderness on palpation.  Hip joints and knee joints were in good range of motion without any warmth swelling or effusion.  There was no tenderness over ankles or MTPs.  CDAI Exam: CDAI Score: -- Patient Global: --; Provider Global: -- Swollen: --; Tender: -- Joint Exam 09/04/2023   No joint exam has been documented for this visit   There is currently no information documented on the homunculus. Go to the Rheumatology activity and complete the homunculus joint exam.  Investigation: No additional findings.  Imaging: No results found.  Recent Labs: Lab Results  Component Value Date   WBC 4.6  01/20/2023   HGB 10.6 (L) 01/20/2023   PLT 223 01/20/2023   NA 136 01/20/2023   K 3.5 01/20/2023   CL 105 01/20/2023   CO2 21 (L) 01/20/2023   GLUCOSE 87 01/20/2023   BUN 9 01/20/2023   CREATININE 0.63 01/20/2023   BILITOT 0.4 01/20/2023   ALKPHOS 59 01/20/2023   AST 23 01/20/2023   ALT 27 01/20/2023   PROT 7.2 01/20/2023   ALBUMIN 3.6 01/20/2023   CALCIUM 9.0 01/20/2023   QFTBGOLDPLUS Negative 12/11/2019   March 14, 2023 ANA 1: 640 centromere, CRP<1.0 sed rate 20, TSH normal, vitamin D  9.9, RF<10 point, hemoglobin A1c 5.3, hepatitis B nonreactive, LDL 127, CMP creatinine 0.53, AST 20, ALT 22, CBC WBC 4.5, hemoglobin 12.4, platelets 331, UA negative for proteinuria April 27, 2023  anti-CCP 7, anti-dsDNA negative, HIV nonreactive, HCV antibody nonreactive, C3-C4 normal, ENA SSA 6.8, SSB negative, SCL 70 negative, Smith negative, RNP negative  Speciality Comments: No specialty comments available.  Procedures:  No procedures performed Allergies: Patient has no known allergies.   Assessment / Plan:     Visit Diagnoses: Positive ANA (antinuclear antibody) - Positive ANA 1: 640 centromere, positive SSA 6.8, complements normal, sed rate normal.  Patient gives history of fatigue and arthralgias and myalgias.  There is no history of Raynaud's phenomenon, photosensitivity or sicca symptoms.  No capillary changes or sclerodactyly was noted.  No telangiectasia were noted.  I will repeat autoimmune labs.- Plan: ANA, Sjogrens syndrome-A extractable nuclear antibody, CK  Polyarthralgia-she complains of pain in multiple joints including her neck, left trapezius region, wrist, hands, knees, feet.  She denies any history of joint swelling.  No synovitis was noted.  Pain in both hands -she complains of achiness in her both hands.  No synovitis was noted on the examination.  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  She is constantly busy.  Plan: XR Hand 2 View Right, XR Hand 2 View Left.  X-rays of  bilateral hands were unremarkable.  A handout on the hand exercises was placed in the AVS.  Chronic pain of both knees -she complains of discomfort in her knee joints.  No warmth swelling or effusion was noted.  Plan: XR KNEE 3 VIEW RIGHT, XR KNEE 3 VIEW LEFT.  X-rays of bilateral knee joints were unremarkable.  A handout on lower extremity exercises was placed in the AVS.  Neck pain -she complains of a stiffness in her neck.  Plan: XR Cervical Spine 2 or 3 views. Loss of lordosis was noted.  Unremarkable x-rays of the cervical spine. A handout on the neck exercises was placed in the AVS.  Myalgia -she complains of generalized achiness in her muscles.  She had no muscular weakness.  She could get up from the squatting position without any difficulty.  Plan: CK  Other fatigue -she gives history of fatigue.  She patient states she sleeps well through the night except when she she experiences nocturnal pain.  Plan: Serum protein electrophoresis with reflex  Vitamin D  deficiency -her vitamin D  was low at 9.9 in January.  She has been taking vitamin D  50,000 units once a week.  Will recheck vitamin D  level.  Vitamin D  deficiency can also cause arthralgias and myalgias.  Plan: VITAMIN D  25 Hydroxy (Vit-D Deficiency, Fractures)  Hyperhidrosis  Language barrier-interpreter was present throughout the visit.  Orders: Orders Placed This Encounter  Procedures   XR Hand 2 View Right   XR Hand 2 View Left   XR KNEE 3 VIEW RIGHT   XR KNEE 3 VIEW LEFT   XR Cervical Spine 2 or 3 views   ANA   Sjogrens syndrome-A extractable nuclear antibody   VITAMIN D  25 Hydroxy (Vit-D Deficiency, Fractures)   CK   Serum protein electrophoresis with reflex   No orders of the defined types were placed in this encounter.   Face-to-face time spent with patient was over 45 minutes. Greater than 50% of time was spent in counseling and coordination of care.  Follow-Up Instructions: Return for Polyarthralgia,  myalgia.   Maya Nash, MD  Note - This record has been created using Animal nutritionist.  Chart creation errors have been sought, but may not always  have been located. Such creation errors do not reflect on  the standard of medical  care.

## 2023-09-04 ENCOUNTER — Ambulatory Visit

## 2023-09-04 ENCOUNTER — Encounter: Payer: Self-pay | Admitting: Rheumatology

## 2023-09-04 ENCOUNTER — Ambulatory Visit (INDEPENDENT_AMBULATORY_CARE_PROVIDER_SITE_OTHER)

## 2023-09-04 ENCOUNTER — Ambulatory Visit: Payer: Medicaid Other | Attending: Rheumatology | Admitting: Rheumatology

## 2023-09-04 VITALS — BP 104/70 | HR 88 | Resp 16 | Ht 59.0 in | Wt 139.2 lb

## 2023-09-04 DIAGNOSIS — M79641 Pain in right hand: Secondary | ICD-10-CM | POA: Diagnosis present

## 2023-09-04 DIAGNOSIS — M25561 Pain in right knee: Secondary | ICD-10-CM | POA: Diagnosis not present

## 2023-09-04 DIAGNOSIS — M25562 Pain in left knee: Secondary | ICD-10-CM

## 2023-09-04 DIAGNOSIS — M2550 Pain in unspecified joint: Secondary | ICD-10-CM | POA: Insufficient documentation

## 2023-09-04 DIAGNOSIS — G8929 Other chronic pain: Secondary | ICD-10-CM | POA: Diagnosis present

## 2023-09-04 DIAGNOSIS — M542 Cervicalgia: Secondary | ICD-10-CM

## 2023-09-04 DIAGNOSIS — M79642 Pain in left hand: Secondary | ICD-10-CM | POA: Insufficient documentation

## 2023-09-04 DIAGNOSIS — R5383 Other fatigue: Secondary | ICD-10-CM | POA: Diagnosis present

## 2023-09-04 DIAGNOSIS — M791 Myalgia, unspecified site: Secondary | ICD-10-CM | POA: Insufficient documentation

## 2023-09-04 DIAGNOSIS — R768 Other specified abnormal immunological findings in serum: Secondary | ICD-10-CM | POA: Insufficient documentation

## 2023-09-04 DIAGNOSIS — R61 Generalized hyperhidrosis: Secondary | ICD-10-CM | POA: Diagnosis present

## 2023-09-04 DIAGNOSIS — Z603 Acculturation difficulty: Secondary | ICD-10-CM | POA: Insufficient documentation

## 2023-09-04 DIAGNOSIS — Z758 Other problems related to medical facilities and other health care: Secondary | ICD-10-CM | POA: Diagnosis present

## 2023-09-04 DIAGNOSIS — E559 Vitamin D deficiency, unspecified: Secondary | ICD-10-CM | POA: Diagnosis present

## 2023-09-04 NOTE — Patient Instructions (Addendum)
Cervical Strain and Sprain Rehab Ask your health care provider which exercises are safe for you. Do exercises exactly as told by your health care provider and adjust them as directed. It is normal to feel mild stretching, pulling, tightness, or discomfort as you do these exercises. Stop right away if you feel sudden pain or your pain gets worse. Do not begin these exercises until told by your health care provider. Stretching and range-of-motion exercises Cervical side bending  Using good posture, sit on a stable chair or stand up. Without moving your shoulders, slowly tilt your left / right ear to your shoulder until you feel a stretch in the neck muscles on the opposite side. You should be looking straight ahead. Hold for __________ seconds. Repeat with the other side of your neck. Repeat __________ times. Complete this exercise __________ times a day. Cervical rotation  Using good posture, sit on a stable chair or stand up. Slowly turn your head to the side as if you are looking over your left / right shoulder. Keep your eyes level with the ground. Stop when you feel a stretch along the side and the back of your neck. Hold for __________ seconds. Repeat this by turning to your other side. Repeat __________ times. Complete this exercise __________ times a day. Thoracic extension and pectoral stretch  Roll a towel or a small blanket so it is about 4 inches (10 cm) in diameter. Lie down on your back on a firm surface. Put the towel in the middle of your back across your spine. It should not be under your shoulder blades. Put your hands behind your head and let your elbows fall out to your sides. Hold for __________ seconds. Repeat __________ times. Complete this exercise __________ times a day. Strengthening exercises Upper cervical flexion  Lie on your back with a thin pillow behind your head or a small, rolled-up towel under your neck. Gently tuck your chin toward your chest and nod  your head down to look toward your feet. Do not lift your head off the pillow. Hold for __________ seconds. Release the tension slowly. Relax your neck muscles completely before you repeat this exercise. Repeat __________ times. Complete this exercise __________ times a day. Cervical extension  Stand about 6 inches (15 cm) away from a wall, with your back facing the wall. Place a soft object, about 6-8 inches (15-20 cm) in diameter, between the back of your head and the wall. A soft object could be a small pillow, a ball, or a folded towel. Gently tilt your head back and press into the soft object. Keep your jaw and forehead relaxed. Hold for __________ seconds. Release the tension slowly. Relax your neck muscles completely before you repeat this exercise. Repeat __________ times. Complete this exercise __________ times a day. Posture and body mechanics Body mechanics refer to the movements and positions of your body while you do your daily activities. Posture is part of body mechanics. Good posture and healthy body mechanics can help to relieve stress in your body's tissues and joints. Good posture means that your spine is in its natural S-curve position (your spine is neutral), your shoulders are pulled back slightly, and your head is not tipped forward. The following are general guidelines for using improved posture and body mechanics in your everyday activities. Sitting  When sitting, keep your spine neutral and keep your feet flat on the floor. Use a footrest, if needed, and keep your thighs parallel to the floor. Avoid rounding  your shoulders. Avoid tilting your head forward. When working at a desk or a computer, keep your desk at a height where your hands are slightly lower than your elbows. Slide your chair under your desk so you are close enough to maintain good posture. When working at a computer, place your monitor at a height where you are looking straight ahead and you do not have to  tilt your head forward or downward to look at the screen. Standing  When standing, keep your spine neutral and keep your feet about hip-width apart. Keep a slight bend in your knees. Your ears, shoulders, and hips should line up. When you do a task in which you stand in one place for a long time, place one foot up on a stable object that is 2-4 inches (5-10 cm) high, such as a footstool. This helps keep your spine neutral. Resting When lying down and resting, avoid positions that are most painful for you. Try to support your neck in a neutral position. You can use a contour pillow or a small rolled-up towel. Your pillow should support your neck but not push on it. This information is not intended to replace advice given to you by your health care provider. Make sure you discuss any questions you have with your health care provider. Document Revised: 06/05/2022 Document Reviewed: 08/22/2021 Elsevier Patient Education  2024 [employer] Hand Exercises Hand exercises can be helpful for almost anyone. They can strengthen your hands and improve flexibility and movement. The exercises can also increase blood flow to the hands. These results can make your work and daily tasks easier for you. Hand exercises can be especially helpful for people who have joint pain from arthritis or nerve damage from using their hands over and over. These exercises can also help people who injure a hand. Exercises Most of these hand exercises are gentle stretching and motion exercises. It is usually safe to do them often throughout the day. Warming up your hands before exercise may help reduce stiffness. You can do this with gentle massage or by placing your hands in warm water for 10-15 minutes. It is normal to feel some stretching, pulling, tightness, or mild discomfort when you begin new exercises. In time, this will improve. Remember to always be careful and stop right away if you feel sudden, very bad pain or your pain  gets worse. You want to get better and be safe. Ask your health care provider which exercises are safe for you. Do exercises exactly as told by your provider and adjust them as told. Do not begin these exercises until told by your provider. Knuckle bend or "claw" fist  Stand or sit with your arm, hand, and all five fingers pointed straight up. Make sure to keep your wrist straight. Gently bend your fingers down toward your palm until the tips of your fingers are touching your palm. Keep your big knuckle straight and only bend the small knuckles in your fingers. Hold this position for 10 seconds. Straighten your fingers back to your starting position. Repeat this exercise 5-10 times with each hand. Full finger fist  Stand or sit with your arm, hand, and all five fingers pointed straight up. Make sure to keep your wrist straight. Gently bend your fingers into your palm until the tips of your fingers are touching the middle of your palm. Hold this position for 10 seconds. Extend your fingers back to your starting position, stretching every joint fully. Repeat this exercise 5-10 times  with each hand. Straight fist  Stand or sit with your arm, hand, and all five fingers pointed straight up. Make sure to keep your wrist straight. Gently bend your fingers at the big knuckle, where your fingers meet your hand, and at the middle knuckle. Keep the knuckle at the tips of your fingers straight and try to touch the bottom of your palm. Hold this position for 10 seconds. Extend your fingers back to your starting position, stretching every joint fully. Repeat this exercise 5-10 times with each hand. Tabletop  Stand or sit with your arm, hand, and all five fingers pointed straight up. Make sure to keep your wrist straight. Gently bend your fingers at the big knuckle, where your fingers meet your hand, as far down as you can. Keep the small knuckles in your fingers straight. Think of forming a tabletop with  your fingers. Hold this position for 10 seconds. Extend your fingers back to your starting position, stretching every joint fully. Repeat this exercise 5-10 times with each hand. Finger spread  Place your hand flat on a table with your palm facing down. Make sure your wrist stays straight. Spread your fingers and thumb apart from each other as far as you can until you feel a gentle stretch. Hold this position for 10 seconds. Bring your fingers and thumb tight together again. Hold this position for 10 seconds. Repeat this exercise 5-10 times with each hand. Making circles  Stand or sit with your arm, hand, and all five fingers pointed straight up. Make sure to keep your wrist straight. Make a circle by touching the tip of your thumb to the tip of your index finger. Hold for 10 seconds. Then open your hand wide. Repeat this motion with your thumb and each of your fingers. Repeat this exercise 5-10 times with each hand. Thumb motion  Sit with your forearm resting on a table and your wrist straight. Your thumb should be facing up toward the ceiling. Keep your fingers relaxed as you move your thumb. Lift your thumb up as high as you can toward the ceiling. Hold for 10 seconds. Bend your thumb across your palm as far as you can, reaching the tip of your thumb for the small finger (pinkie) side of your palm. Hold for 10 seconds. Repeat this exercise 5-10 times with each hand. Grip strengthening  Hold a stress ball or other soft ball in the middle of your hand. Slowly increase the pressure, squeezing the ball as much as you can without causing pain. Think of bringing the tips of your fingers into the middle of your palm. All of your finger joints should bend when doing this exercise. Hold your squeeze for 10 seconds, then relax. Repeat this exercise 5-10 times with each hand. Contact a health care provider if: Your hand pain or discomfort gets much worse when you do an exercise. Your hand pain  or discomfort does not improve within 2 hours after you exercise. If you have either of these problems, stop doing these exercises right away. Do not do them again unless your provider says that you can. Get help right away if: You develop sudden, severe hand pain or swelling. If this happens, stop doing these exercises right away. Do not do them again unless your provider says that you can. This information is not intended to replace advice given to you by your health care provider. Make sure you discuss any questions you have with your health care provider. Document Revised: 02/14/2022  Document Reviewed: 02/14/2022 Elsevier Patient Education  2024 [employer] Exercises for Chronic Knee Pain Chronic knee pain is pain that lasts longer than 3 months. For most people with chronic knee pain, exercise and weight loss is an important part of treatment. Your health care provider may want you to focus on: Making the muscles that support your knee stronger. This can take pressure off your knee and reduce pain. Preventing knee stiffness. How far you can move your knee, keeping it there or making it farther. Losing weight (if this applies) to take pressure off your knee, lower your risk for injury, and make it easier for you to exercise. Your provider will help you make an exercise program that fits your needs and physical abilities. Below are simple, low-impact exercises you can do at home. Ask your provider or physical therapist how often you should do your exercise program and how many times to repeat each exercise. General safety tips  Get your provider's approval before doing any exercises. Start slowly and stop any time you feel pain. Do not exercise if your knee pain is flaring up. Warm up first. Stretching a cold muscle can cause an injury. Do 5-10 minutes of easy movement or light stretching before beginning your exercises. Do 5-10 minutes of low-impact activity (like walking or cycling)  before starting strengthening exercises. Contact your provider any time you have pain during or after exercising. Exercise can cause discomfort but should not be painful. It is normal to be a little stiff or sore after exercising. Stretching and range-of-motion exercises Front thigh stretch  Stand up straight and support your body by holding on to a chair or resting one hand on a wall. With your legs straight and close together, bend one knee to lift your heel up toward your butt. Using one hand for support, grab your ankle with your free hand. Pull your foot up closer toward your butt to feel the stretch in front of your thigh. Hold the stretch for 30 seconds. Repeat __________ times. Complete this exercise __________ times a day. Back thigh stretch  Sit on the floor with your back straight and your legs out straight in front of you. Place the palms of your hands on the floor and slide them toward your feet as you bend at the hip. Try to touch your nose to your knees and feel the stretch in the back of your thighs. Hold for 30 seconds. Repeat __________ times. Complete this exercise __________ times a day. Calf stretch  Stand facing a wall. Place the palms of your hands flat against the wall, arms extended, and lean slightly against the wall. Get into a lunge position with one leg bent at the knee and the other leg stretched out straight behind you. Keep both feet facing the wall and increase the bend in your knee while keeping the heel of the other leg flat on the ground. You should feel the stretch in your calf. Hold for 30 seconds. Repeat __________ times. Complete this exercise __________ times a day. Strengthening exercises Straight leg lift  Lie on your back with one knee bent and the other leg out straight. Slowly lift the straight leg without bending the knee. Lift until your foot is about 12 inches (30 cm) off the floor. Hold for 3-5 seconds and slowly lower your  leg. Repeat __________ times. Complete this exercise __________ times a day. Single leg dip  Stand between two chairs and put both hands on the backs of the  chairs for support. Extend one leg out straight with your body weight resting on the heel of the standing leg. Slowly bend your standing knee to dip your body to the level that is comfortable for you. Hold for 3-5 seconds. Repeat __________ times. Complete this exercise __________ times a day. Hamstring curls  Stand straight, knees close together, facing the back of a chair. Hold on to the back of a chair with both hands. Keep one leg straight. Bend the other knee while bringing the heel up toward the butt until the knee is bent at a 90-degree angle (right angle). Hold for 3-5 seconds. Repeat __________ times. Complete this exercise __________ times a day. Wall squat  Stand straight with your back, hips, and head against a wall. Step forward one foot at a time with your back still against the wall. Your feet should be 2 feet (61 cm) from the wall at shoulder width. Keeping your back, hips, and head against the wall, slide down the wall to as close to a sitting position as you can get. Hold for 5-10 seconds, then slowly slide back up. Repeat __________ times. Complete this exercise __________ times a day. Step-ups  Stand in front of a sturdy platform or stool that is about 6 inches (15 cm) high. Slowly step up with your left / right foot, keeping your knee in line with your hip and foot. Do not let your knee bend so far that you cannot see your toes. Hold on to a chair for balance, but do not use it for support. Slowly unlock your knee and lower yourself to the starting position. Repeat __________ times. Complete this exercise __________ times a day. Contact a health care provider if: Your exercises cause pain. Your pain is worse after you exercise. Your pain prevents you from doing your exercises. This information is not intended  to replace advice given to you by your health care provider. Make sure you discuss any questions you have with your health care provider. Document Revised: 02/14/2022 Document Reviewed: 02/14/2022 Elsevier Patient Education  2024 [employer].

## 2023-09-05 ENCOUNTER — Ambulatory Visit: Payer: Self-pay | Admitting: Rheumatology

## 2023-09-05 NOTE — Progress Notes (Signed)
 Vitamin D  is low at 16.  Patient should take vitamin D  50,000 units twice a week for 3 months.  She should have repeat vitamin D  in 3 months.  She should stay on vitamin D  2000 units daily after finishing the course of vitamin D .  Other labs are pending.  Please forward results to her PCP.

## 2023-09-06 ENCOUNTER — Other Ambulatory Visit: Payer: Self-pay | Admitting: *Deleted

## 2023-09-06 DIAGNOSIS — E559 Vitamin D deficiency, unspecified: Secondary | ICD-10-CM

## 2023-09-06 MED ORDER — VITAMIN D (ERGOCALCIFEROL) 1.25 MG (50000 UNIT) PO CAPS: ORAL_CAPSULE | ORAL | 0 refills | Status: DC

## 2023-09-07 LAB — PROTEIN ELECTROPHORESIS, SERUM, WITH REFLEX
Albumin ELP: 4.6 g/dL (ref 3.8–4.8)
Alpha 1: 0.3 g/dL (ref 0.2–0.3)
Alpha 2: 0.6 g/dL (ref 0.5–0.9)
Beta 2: 0.4 g/dL (ref 0.2–0.5)
Beta Globulin: 0.6 g/dL (ref 0.4–0.6)
Gamma Globulin: 1.4 g/dL (ref 0.8–1.7)
Total Protein: 7.8 g/dL (ref 6.1–8.1)

## 2023-09-07 LAB — VITAMIN D 25 HYDROXY (VIT D DEFICIENCY, FRACTURES): Vit D, 25-Hydroxy: 16 ng/mL — ABNORMAL LOW (ref 30–100)

## 2023-09-07 LAB — CK: Total CK: 94 U/L (ref 20–239)

## 2023-09-07 LAB — ANTI-NUCLEAR AB-TITER (ANA TITER): ANA Titer 1: 1:1280 {titer} — ABNORMAL HIGH

## 2023-09-07 LAB — SJOGRENS SYNDROME-A EXTRACTABLE NUCLEAR ANTIBODY: SSA (Ro) (ENA) Antibody, IgG: 7.2 AI — AB

## 2023-09-07 LAB — ANA: Anti Nuclear Antibody (ANA): POSITIVE — AB

## 2023-09-08 NOTE — Progress Notes (Signed)
 I will discuss results at the follow-up visit.

## 2023-09-25 NOTE — Progress Notes (Deleted)
 Office Visit Note  Patient: Jennifer Fisher             Date of Birth: Feb 08, 1991           MRN: [id number]             PCP: [facility] Referring: [facility] Visit Date: 10/09/2023 Occupation: @GUAROCC @  Subjective:  No chief complaint on file.   History of Present Illness: Jennifer Fisher is a 33 y.o. female ***     Activities of Daily Living:  Patient reports morning stiffness for *** {minute/hour:19697}.   Patient {ACTIONS;DENIES/REPORTS:21021675::Denies} nocturnal pain.  Difficulty dressing/grooming: {ACTIONS;DENIES/REPORTS:21021675::Denies} Difficulty climbing stairs: {ACTIONS;DENIES/REPORTS:21021675::Denies} Difficulty getting out of chair: {ACTIONS;DENIES/REPORTS:21021675::Denies} Difficulty using hands for taps, buttons, cutlery, and/or writing: {ACTIONS;DENIES/REPORTS:21021675::Denies}  No Rheumatology ROS completed.   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    Past Medical History:  Diagnosis Date   Seasonal allergies     Family History  Problem Relation Age of Onset   Healthy Mother    Healthy Father    Healthy Sister    Healthy Sister    Healthy Sister    Healthy Brother    Healthy Brother    Past Surgical History:  Procedure Laterality Date   APPENDECTOMY  2017   in [place]   LAPAROSCOPIC BILATERAL SALPINGECTOMY Bilateral 01/18/2022   Procedure: LAPAROSCOPIC BILATERAL SALPINGECTOMY;  Surgeon: Cleatus Moccasin, MD;  Location: [facility];  Service: Gynecology;  Laterality: Bilateral;   Social History   Social History Narrative   ** Merged History Encounter **       There is no immunization history for the selected administration types on file for this patient.   Objective: Vital Signs: There were no vitals taken for this visit.   Physical Exam    Musculoskeletal Exam: ***  CDAI Exam: CDAI Score: -- Patient Global: --; Provider Global: -- Swollen: --; Tender: -- Joint Exam 10/09/2023   No joint exam has been documented for this visit   There is currently no information documented on the homunculus. Go to the Rheumatology activity and complete the homunculus joint exam.  Investigation: No additional findings.  Imaging: XR KNEE 3 VIEW LEFT Result Date: 09/04/2023 No medial or lateral compartment narrowing was noted.  No patellofemoral narrowing was noted.  No chondrocalcinosis was noted. Impression: Unremarkable x-rays of the knee joint.  XR KNEE 3 VIEW RIGHT Result Date: 09/04/2023 No medial or lateral compartment narrowing was noted.  No patellofemoral narrowing was noted.  No chondrocalcinosis was noted. Impression: Unremarkable x-rays of the knee joint.  XR Cervical Spine 2 or 3 views Result Date: 09/04/2023 Loss of lordosis was noted.  No significant disc space narrowing or facet joint arthropathy was noted. Impression: Unremarkable x-rays of the cervical spine.  XR Hand 2 View Left Result Date: 09/04/2023 No CMC, MCP, PIP, DIP narrowing was noted.  No erosive changes were noted. Impression: Unremarkable x-rays of the hand.  XR Hand 2 View Right Result Date: 09/04/2023 No CMC, MCP, PIP, DIP narrowing was noted.  No erosive changes were noted. Impression: Unremarkable x-rays of the hand.   Recent Labs: Lab Results  Component Value Date   WBC 4.6 01/20/2023   HGB 10.6 (L) 01/20/2023   PLT 223 01/20/2023   NA 136 01/20/2023   K 3.5 01/20/2023   CL 105 01/20/2023   CO2  21 (L) 01/20/2023   GLUCOSE 87 01/20/2023   BUN 9 01/20/2023   CREATININE 0.63 01/20/2023   BILITOT 0.4 01/20/2023   ALKPHOS 59 01/20/2023   AST 23 01/20/2023   ALT 27 01/20/2023   PROT 7.8 09/04/2023   ALBUMIN 3.6 01/20/2023   CALCIUM 9.0 01/20/2023   QFTBGOLDPLUS Negative 12/11/2019   September 04, 2023 ANA 1: 1280 centromere, nuclear, SSA  7.2, SPEP normal, CK 94, vitamin D  16  Speciality Comments: No specialty comments available.  Procedures:  No procedures performed Allergies: Patient has no known allergies.   Assessment / Plan:     Visit Diagnoses: No diagnosis found.  Orders: No orders of the defined types were placed in this encounter.  No orders of the defined types were placed in this encounter.   Face-to-face time spent with patient was *** minutes. Greater than 50% of time was spent in counseling and coordination of care.  Follow-Up Instructions: No follow-ups on file.   Maya Nash, MD  Note - This record has been created using Animal nutritionist.  Chart creation errors have been sought, but may not always  have been located. Such creation errors do not reflect on  the standard of medical care.

## 2023-10-09 ENCOUNTER — Ambulatory Visit: Payer: Medicaid Other | Admitting: Rheumatology

## 2023-10-09 DIAGNOSIS — M791 Myalgia, unspecified site: Secondary | ICD-10-CM

## 2023-10-09 DIAGNOSIS — R5383 Other fatigue: Secondary | ICD-10-CM

## 2023-10-09 DIAGNOSIS — G8929 Other chronic pain: Secondary | ICD-10-CM

## 2023-10-09 DIAGNOSIS — R768 Other specified abnormal immunological findings in serum: Secondary | ICD-10-CM

## 2023-10-09 DIAGNOSIS — M542 Cervicalgia: Secondary | ICD-10-CM

## 2023-10-09 DIAGNOSIS — Z758 Other problems related to medical facilities and other health care: Secondary | ICD-10-CM

## 2023-10-09 DIAGNOSIS — R61 Generalized hyperhidrosis: Secondary | ICD-10-CM

## 2023-10-09 DIAGNOSIS — M2550 Pain in unspecified joint: Secondary | ICD-10-CM

## 2023-10-09 DIAGNOSIS — M79641 Pain in right hand: Secondary | ICD-10-CM
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
## 2024-01-07 ENCOUNTER — Ambulatory Visit: Attending: Rheumatology | Admitting: Rheumatology

## 2024-01-07 ENCOUNTER — Encounter: Payer: Self-pay | Admitting: Rheumatology

## 2024-01-07 VITALS — BP 107/73 | HR 80 | Temp 97.7°F | Resp 16 | Ht 59.06 in | Wt 132.8 lb

## 2024-01-07 DIAGNOSIS — M79641 Pain in right hand: Secondary | ICD-10-CM | POA: Insufficient documentation

## 2024-01-07 DIAGNOSIS — Z758 Other problems related to medical facilities and other health care: Secondary | ICD-10-CM | POA: Insufficient documentation

## 2024-01-07 DIAGNOSIS — F439 Reaction to severe stress, unspecified: Secondary | ICD-10-CM | POA: Insufficient documentation

## 2024-01-07 DIAGNOSIS — R61 Generalized hyperhidrosis: Secondary | ICD-10-CM | POA: Insufficient documentation

## 2024-01-07 DIAGNOSIS — R7689 Other specified abnormal immunological findings in serum: Secondary | ICD-10-CM | POA: Insufficient documentation

## 2024-01-07 DIAGNOSIS — Z603 Acculturation difficulty: Secondary | ICD-10-CM | POA: Insufficient documentation

## 2024-01-07 DIAGNOSIS — M791 Myalgia, unspecified site: Secondary | ICD-10-CM | POA: Insufficient documentation

## 2024-01-07 DIAGNOSIS — M542 Cervicalgia: Secondary | ICD-10-CM | POA: Diagnosis present

## 2024-01-07 DIAGNOSIS — E559 Vitamin D deficiency, unspecified: Secondary | ICD-10-CM | POA: Diagnosis present

## 2024-01-07 DIAGNOSIS — M79642 Pain in left hand: Secondary | ICD-10-CM | POA: Diagnosis present

## 2024-01-07 DIAGNOSIS — Z9851 Tubal ligation status: Secondary | ICD-10-CM | POA: Insufficient documentation

## 2024-01-07 DIAGNOSIS — M2550 Pain in unspecified joint: Secondary | ICD-10-CM | POA: Diagnosis not present

## 2024-01-07 DIAGNOSIS — M25562 Pain in left knee: Secondary | ICD-10-CM | POA: Diagnosis present

## 2024-01-07 DIAGNOSIS — M25561 Pain in right knee: Secondary | ICD-10-CM | POA: Insufficient documentation

## 2024-01-07 DIAGNOSIS — G8929 Other chronic pain: Secondary | ICD-10-CM | POA: Insufficient documentation

## 2024-01-07 DIAGNOSIS — R5383 Other fatigue: Secondary | ICD-10-CM | POA: Insufficient documentation

## 2024-01-07 NOTE — Patient Instructions (Addendum)
 Standing Labs We placed an order today for your standing lab work.   Please have your standing labs drawn in May  Please have your labs drawn 2 weeks prior to your appointment so that the provider can discuss your lab results at your appointment, if possible.  Please note that you may see your imaging and lab results in MyChart before we have reviewed them. We will contact you once all results are reviewed. Please allow our office up to 72 hours to thoroughly review all of the results before contacting the office for clarification of your results.  WALK-IN LAB HOURS  Monday through Thursday from 8:00 am - 4:30 pm and Friday from 8:00 am-12:00 pm.  Patients with office visits requiring labs will be seen before walk-in labs.  You may encounter longer than normal wait times. Please allow additional time. Wait times may be shorter on  Monday and Thursday afternoons.  We do not book appointments for walk-in labs. We appreciate your patience and understanding with our staff.   Labs are drawn by [facility]. Please bring your co-pay at the time of your lab draw.  You may receive a bill from [facility] for your lab work.  Please note if you are on Hydroxychloroquine  and and an order has been placed for a Hydroxychloroquine  level,  you will need to have it drawn 4 hours or more after your last dose.  If you wish to have your labs drawn at another location, please call the office 24 hours in advance so we can fax the orders.  The office is located at [address], [address], [place], [place] [address]   If you have any questions regarding directions or hours of operation,  please call [phone].   As a reminder, please drink plenty of water prior to coming for your lab work. Thanks!

## 2024-01-08 ENCOUNTER — Ambulatory Visit: Payer: Self-pay | Admitting: Rheumatology

## 2024-01-08 DIAGNOSIS — E559 Vitamin D deficiency, unspecified: Secondary | ICD-10-CM

## 2024-01-08 LAB — VITAMIN D 25 HYDROXY (VIT D DEFICIENCY, FRACTURES): Vit D, 25-Hydroxy: 53 ng/mL (ref 30–100)

## 2024-01-08 NOTE — Progress Notes (Signed)
 Vitamin D  is normal now.  Patient may reduce vitamin D  to 50,000 units once a week.  She should have repeat vitamin D  in 4 months.  Once she finishes prescription vitamin D  she can switch to vitamin D  5000 units daily.

## 2024-01-09 MED ORDER — VITAMIN D (ERGOCALCIFEROL) 1.25 MG (50000 UNIT) PO CAPS: 50000.0000 [IU] | ORAL_CAPSULE | ORAL | 0 refills | Status: AC

## 2024-01-09 NOTE — Telephone Encounter (Signed)
-----   Message from [facility] sent at 01/08/2024  8:29 AM EST ----- Vitamin D  is normal now.  Patient may reduce vitamin D  to 50,000 units once a week.  She should have repeat vitamin D  in 4 months.  Once she finishes prescription vitamin D  she can switch to vitamin  D 5000 units daily. ----- Message ----- From: Interface, [facility] Lab Results In Sent: 01/08/2024  12:04 AM EST To: Maya Nash, MD

## 2024-07-08 ENCOUNTER — Ambulatory Visit: Admitting: Rheumatology
